# Patient Record
Sex: Male | Born: 1970 | Race: White | Hispanic: No | Marital: Married | Smoking: Never smoker
Health system: Southern US, Community
[De-identification: ages and names within clinical notes are randomized; demographics above are authoritative.]

## PROBLEM LIST (undated history)

## (undated) DIAGNOSIS — E785 Hyperlipidemia, unspecified: Secondary | ICD-10-CM

## (undated) DIAGNOSIS — I10 Essential (primary) hypertension: Secondary | ICD-10-CM

## (undated) HISTORY — PX: KIDNEY STONE SURGERY: SHX686

---

## 2010-05-12 ENCOUNTER — Ambulatory Visit: Payer: Self-pay | Admitting: Diagnostic Radiology

## 2010-05-12 ENCOUNTER — Emergency Department (HOSPITAL_BASED_OUTPATIENT_CLINIC_OR_DEPARTMENT_OTHER): Admission: EM | Admit: 2010-05-12 | Discharge: 2010-05-12 | Payer: Self-pay | Admitting: Emergency Medicine

## 2011-02-13 LAB — URINE CULTURE
Colony Count: NO GROWTH
Culture: NO GROWTH

## 2011-02-13 LAB — URINALYSIS, ROUTINE W REFLEX MICROSCOPIC
Glucose, UA: 500 mg/dL — AB
Leukocytes, UA: NEGATIVE

## 2011-02-13 LAB — CBC
HCT: 42.6 % (ref 39.0–52.0)
Hemoglobin: 14.4 g/dL (ref 13.0–17.0)

## 2011-02-13 LAB — DIFFERENTIAL
Basophils Relative: 0 % (ref 0–1)
Eosinophils Absolute: 0.2 10*3/uL (ref 0.0–0.7)
Eosinophils Relative: 1 % (ref 0–5)
Lymphs Abs: 1.3 10*3/uL (ref 0.7–4.0)
Monocytes Absolute: 0.2 10*3/uL (ref 0.1–1.0)
Monocytes Relative: 1 % — ABNORMAL LOW (ref 3–12)
Neutro Abs: 15.1 10*3/uL — ABNORMAL HIGH (ref 1.7–7.7)
Neutrophils Relative %: 90 % — ABNORMAL HIGH (ref 43–77)

## 2011-02-13 LAB — BASIC METABOLIC PANEL
CO2: 25 mEq/L (ref 19–32)
Chloride: 109 mEq/L (ref 96–112)
Creatinine, Ser: 1.1 mg/dL (ref 0.4–1.5)
Glucose, Bld: 158 mg/dL — ABNORMAL HIGH (ref 70–99)

## 2011-02-13 LAB — URINE MICROSCOPIC-ADD ON

## 2013-01-28 ENCOUNTER — Emergency Department (INDEPENDENT_AMBULATORY_CARE_PROVIDER_SITE_OTHER)
Admission: EM | Admit: 2013-01-28 | Discharge: 2013-01-28 | Disposition: A | Discharge status: Home / Self Care | Payer: 59 | Source: Home / Self Care | Attending: Family Medicine | Admitting: Family Medicine

## 2013-01-28 ENCOUNTER — Ambulatory Visit (INDEPENDENT_AMBULATORY_CARE_PROVIDER_SITE_OTHER): Payer: 59 | Admitting: Sports Medicine

## 2013-01-28 ENCOUNTER — Encounter: Payer: Self-pay | Admitting: *Deleted

## 2013-01-28 DIAGNOSIS — M25512 Pain in left shoulder: Secondary | ICD-10-CM

## 2013-01-28 DIAGNOSIS — M25519 Pain in unspecified shoulder: Secondary | ICD-10-CM

## 2013-01-28 DIAGNOSIS — M7522 Bicipital tendinitis, left shoulder: Secondary | ICD-10-CM

## 2013-01-28 HISTORY — DX: Essential (primary) hypertension: I10

## 2013-01-28 HISTORY — DX: Hyperlipidemia, unspecified: E78.5

## 2013-01-28 MED ORDER — MELOXICAM 15 MG PO TABS: ORAL_TABLET | ORAL | Status: DC

## 2013-01-28 NOTE — ED Provider Notes (Signed)
History     CSN: [id number]  Arrival date & time 01/28/13  0935   First MD Initiated Contact with Patient 01/28/13 [phone]      Chief Complaint  Patient presents with  . Shoulder Pain   HPI Comments: L shoulder pain x 2 days  No known injury Pt woke up from sleep with moderate  L shoulder pain  No distal paresthesias or numbness.  No prior hx/o shoulder injury  Pain worse with shoulder abduction >60 degrees. Grip strength intact    Patient is a 42 y.o. male presenting with shoulder pain. The history is provided by the patient.  Shoulder Pain This is a new problem. The current episode started 2 days ago. The problem occurs daily. Exacerbated by: Lifting arm > 60 degrees. The symptoms are relieved by rest.    Past Medical History  Diagnosis Date  . Hypertension   . Hyperlipemia     Past Surgical History  Procedure Laterality Date  . Kidney stone surgery      Family History  Problem Relation Age of Onset  . Hypertension Mother     History  Substance Use Topics  . Smoking status: Never Smoker   . Smokeless tobacco: Not on file  . Alcohol Use: Yes      Review of Systems  All other systems reviewed and are negative.    Allergies  Review of patient's allergies indicates no known allergies.  Home Medications   Current Outpatient Rx  Name  Route  Sig  Dispense  Refill  . lisinopril (PRINIVIL,ZESTRIL) 20 MG tablet   Oral   Take 20 mg by mouth daily.           BP 149/90  Pulse 99  Temp(Src) 98.7 F (37.1 C) (Oral)  Ht 6\' 1"  (1.854 m)  Wt 299 lb (135.626 kg)  BMI 39.46 kg/m2  SpO2 98%  Physical Exam  Constitutional:  Obese, NAD   HENT:  Head: Normocephalic and atraumatic.  Eyes: Conjunctivae are normal. Pupils are equal, round, and reactive to light.  Neck: Normal range of motion. Neck supple.  Cardiovascular: Normal rate and regular rhythm.   Pulmonary/Chest: Effort normal and breath sounds normal.  Abdominal: Soft.  Musculoskeletal:   Shoulder: Inspection reveals no abnormalities, atrophy or asymmetry. + TTP in bicipital groove ROM is full in all planes+ pain with L shoulder abduction >60 deg Rotator cuff strength normal throughout. Empty can negative  Speeds and Yergason's tests normal. No labral pathology noted with negative Obrien's, negative clunk and good stability. Normal scapular function observed. No painful arc and no drop arm sign. No apprehension sign   Neurological: He is alert.  Skin: Skin is warm.    ED Course  Procedures (including critical care time)  Labs Reviewed - No data to display No results found.   1. Left shoulder pain   2. Biceps tendonitis, left       MDM  Exam most consistent with biceps tendonitis.  Will consult with sports management to further evaluate.  Defer treatment plan per sports medicine.     The patient and/or caregiver has been counseled thoroughly with regard to treatment plan and/or medications prescribed including dosage, schedule, interactions, rationale for use, and possible side effects and they verbalize understanding. Diagnoses and expected course of recovery discussed and will return if not improved as expected or if the condition worsens. Patient and/or caregiver verbalized understanding.             Doree Albee, MD  02/05/13 0823 

## 2013-01-28 NOTE — Progress Notes (Signed)
  Subjective:    I'm seeing this patient as a consultation for:  Dr. Andrena Mews  CC: Left shoulder pain  HPI: This is a pleasant 42 year-old gentleman who presents with left shoulder pain for two days. The pain was present when he awoke two days ago, and is localized deep in the anterior shoulder. It is has remained unchanged since onset. He is unable to identify any inciting injury. He describes the pain as "dull and achy" and 2/10 in severity at rest. The pain is sharp and reaches 7-8/10 in severity with certain motions. Reaching overhead makes the pain worse. He has taken ibuprofen with minimal improvement.   He denies numbness, tingling, or pain that radiates down his left arm. No joint swelling.  Past medical history, Surgical history, Family history not pertinant except as noted below, Social history, Allergies, and medications have been entered into the medical record, reviewed, and no changes needed.   Review of Systems: No headache, visual changes, nausea, vomiting, diarrhea, constipation, dizziness, abdominal pain, skin rash, fevers, chills, night sweats, weight loss, swollen lymph nodes, body aches, joint swelling, muscle aches, chest pain, shortness of breath, mood changes, visual or auditory hallucinations.   Objective:   General: Well Developed, well nourished, and in no acute distress.  Neuro/Psych: Alert and oriented x3, extra-ocular muscles intact, able to move all 4 extremities, sensation grossly intact. Skin: Warm and dry, no rashes noted.  Respiratory: Not using accessory muscles, speaking in full sentences, trachea midline.  Cardiovascular: Pulses palpable, no extremity edema. Abdomen: Does not appear distended. Right Shoulder: Inspection reveals no abnormalities, atrophy or asymmetry. Palpation reveals mild tenderness over bicipital groove. Decreased ROM with shoulder extension and external rotation. Rotator cuff strength normal throughout. No signs of impingement  with negative Neer and Hawkin's tests, empty can sign. Speeds and Yergason's tests positive. No labral pathology noted with negative Obrien's, negative clunk and good stability. No painful arc and no drop arm sign. No apprehension sign Impression and Recommendations:   This case required medical decision making of moderate complexity.  I was present for all essential parts of this visit and procedure. Ihor Austin. Benjamin Stain, M.D.

## 2013-01-28 NOTE — ED Notes (Signed)
Pt c/o LT shoulder pain x 2 days. Denies injury. He has taken IBF for pain.

## 2013-01-28 NOTE — Assessment & Plan Note (Signed)
Symptoms likely represent biceps tendinitis. We will start conservatively with Mobic, a sling, and home exercises. I am giving him a work note. He will see me back in 4 weeks if no better we can consider a guided injection into the sheath.

## 2013-02-05 ENCOUNTER — Ambulatory Visit (INDEPENDENT_AMBULATORY_CARE_PROVIDER_SITE_OTHER): Payer: 59 | Admitting: Sports Medicine

## 2013-02-05 ENCOUNTER — Encounter: Payer: Self-pay | Admitting: Sports Medicine

## 2013-02-05 DIAGNOSIS — M25519 Pain in unspecified shoulder: Secondary | ICD-10-CM

## 2013-02-05 DIAGNOSIS — M25512 Pain in left shoulder: Secondary | ICD-10-CM

## 2013-02-05 NOTE — Progress Notes (Signed)
  Subjective:    CC: 1 wk follow up after left biceps tendonitis  HPI: This is a pleasant 42 year-old gentleman who presents for follow up after being treated for left biceps tendonitis one week ago. He has taken meloxicam as needed for pain. He kept the left arm in a sling for about two days after the initial visit. For two days now, he has not required any anti-inflammatory medications and reports no pain or limitations in activity. He wishes to return to work immediately.  Past medical history, Surgical history, Family history not pertinant except as noted below, Social history, Allergies, and medications have been entered into the medical record, reviewed, and no changes needed.   Review of Systems: No headache, visual changes, nausea, vomiting, diarrhea, constipation, dizziness, abdominal pain, skin rash, fevers, chills, night sweats, weight loss, swollen lymph nodes, body aches, joint swelling, muscle aches, chest pain, shortness of breath, mood changes, visual or auditory hallucinations.   Objective:   General: Well Developed, well nourished, and in no acute distress.  Neuro/Psych: Alert and oriented x3, extra-ocular muscles intact, able to move all 4 extremities, sensation grossly intact. Skin: Warm and dry, no rashes noted.  Respiratory: Not using accessory muscles, speaking in full sentences, trachea midline.  Cardiovascular: Pulses palpable, no extremity edema. Abdomen: Does not appear distended. Left Shoulder: Inspection reveals no abnormalities, atrophy or asymmetry. Palpation is normal with no tenderness over AC joint or bicipital groove. ROM is full in all planes. Rotator cuff strength normal throughout. No signs of impingement with negative Neer and Hawkin's tests, empty can sign. Speeds and Yergason's tests normal. No labral pathology noted with negative Obrien's, negative clunk and good stability. Normal scapular function observed. No painful arc and no drop arm sign. No  apprehension sign. Impression and Recommendations:   This case required medical decision making of moderate complexity.

## 2013-02-05 NOTE — Assessment & Plan Note (Signed)
Left biceps tendinitis. 100% resolved with conservative measures and rehabilitation. Return as needed, work note written.

## 2013-04-30 ENCOUNTER — Ambulatory Visit (INDEPENDENT_AMBULATORY_CARE_PROVIDER_SITE_OTHER): Payer: Self-pay

## 2013-04-30 ENCOUNTER — Other Ambulatory Visit: Payer: Self-pay | Admitting: Emergency Medicine

## 2013-04-30 DIAGNOSIS — M25562 Pain in left knee: Secondary | ICD-10-CM

## 2013-04-30 DIAGNOSIS — M25569 Pain in unspecified knee: Secondary | ICD-10-CM

## 2013-05-09 ENCOUNTER — Ambulatory Visit (INDEPENDENT_AMBULATORY_CARE_PROVIDER_SITE_OTHER): Payer: 59 | Admitting: Sports Medicine

## 2013-05-09 ENCOUNTER — Encounter: Payer: Self-pay | Admitting: Sports Medicine

## 2013-05-09 VITALS — BP 114/76 | HR 74

## 2013-05-09 DIAGNOSIS — M25519 Pain in unspecified shoulder: Secondary | ICD-10-CM

## 2013-05-09 DIAGNOSIS — Z0000 Encounter for general adult medical examination without abnormal findings: Secondary | ICD-10-CM | POA: Insufficient documentation

## 2013-05-09 DIAGNOSIS — I10 Essential (primary) hypertension: Secondary | ICD-10-CM | POA: Insufficient documentation

## 2013-05-09 DIAGNOSIS — M25512 Pain in left shoulder: Secondary | ICD-10-CM

## 2013-05-09 DIAGNOSIS — Z299 Encounter for prophylactic measures, unspecified: Secondary | ICD-10-CM

## 2013-05-09 MED ORDER — LISINOPRIL-HYDROCHLOROTHIAZIDE 20-12.5 MG PO TABS: 1.0000 | ORAL_TABLET | Freq: Every day | ORAL | Status: DC

## 2013-05-09 NOTE — Assessment & Plan Note (Signed)
Checking routine blood work. He will come back for complete physical.

## 2013-05-09 NOTE — Assessment & Plan Note (Signed)
Continues to be 100% resolved.

## 2013-05-09 NOTE — Progress Notes (Signed)
  Subjective:    CC: Followup  HPI: Biceps tendinitis: Completely resolved.  Hypertension: Very well controlled on lisinopril/Hydrocort thiazide, need refills.  Preventive measure: Due for complete physical, due for some blood work.  Past medical history, Surgical history, Family history not pertinant except as noted below, Social history, Allergies, and medications have been entered into the medical record, reviewed, and no changes needed.   Review of Systems: No fevers, chills, night sweats, weight loss, chest pain, or shortness of breath.   Objective:    General: Well Developed, well nourished, and in no acute distress.  Neuro: Alert and oriented x3, extra-ocular muscles intact, sensation grossly intact.  HEENT: Normocephalic, atraumatic, pupils equal round reactive to light, neck supple, no masses, no lymphadenopathy, thyroid nonpalpable.  Skin: Warm and dry, no rashes. Cardiac: Regular rate and rhythm, no murmurs rubs or gallops, no lower extremity edema.  Respiratory: Clear to auscultation bilaterally. Not using accessory muscles, speaking in full sentences. Left Shoulder: Inspection reveals no abnormalities, atrophy or asymmetry. Palpation is normal with no tenderness over AC joint or bicipital groove. ROM is full in all planes. Rotator cuff strength normal throughout. No signs of impingement with negative Neer and Hawkin's tests, empty can sign. Speeds and Yergason's tests normal. No labral pathology noted with negative Obrien's, negative clunk and good stability. Normal scapular function observed. No painful arc and no drop arm sign. No apprehension sign Impression and Recommendations:

## 2013-05-09 NOTE — Assessment & Plan Note (Signed)
Well-controlled, refilling with one-year supply of lisinopril/hctz.

## 2013-05-17 LAB — CBC
HCT: 38.3 % — ABNORMAL LOW (ref 39.0–52.0)
Hemoglobin: 13.2 g/dL (ref 13.0–17.0)
MCH: 27.2 pg (ref 26.0–34.0)
MCHC: 34.5 g/dL (ref 30.0–36.0)
MCV: 79 fL (ref 78.0–100.0)
Platelets: 350 10*3/uL (ref 150–400)
RBC: 4.85 MIL/uL (ref 4.22–5.81)
RDW: 14.6 % (ref 11.5–15.5)
WBC: 8.4 10*3/uL (ref 4.0–10.5)

## 2013-05-17 LAB — COMPREHENSIVE METABOLIC PANEL WITH GFR
ALT: 28 U/L (ref 0–53)
AST: 17 U/L (ref 0–37)
Albumin: 3.7 g/dL (ref 3.5–5.2)
Alkaline Phosphatase: 77 U/L (ref 39–117)
BUN: 14 mg/dL (ref 6–23)
CO2: 22 meq/L (ref 19–32)
Calcium: 8.9 mg/dL (ref 8.4–10.5)
Chloride: 107 meq/L (ref 96–112)
Creat: 0.95 mg/dL (ref 0.50–1.35)
Glucose, Bld: 91 mg/dL (ref 70–99)
Potassium: 4.4 meq/L (ref 3.5–5.3)
Sodium: 138 meq/L (ref 135–145)
Total Bilirubin: 0.5 mg/dL (ref 0.3–1.2)
Total Protein: 7 g/dL (ref 6.0–8.3)

## 2013-05-17 LAB — TSH: TSH: 4.339 u[IU]/mL (ref 0.350–4.500)

## 2013-05-17 LAB — LIPID PANEL
Cholesterol: 184 mg/dL (ref 0–200)
HDL: 28 mg/dL — ABNORMAL LOW
LDL Cholesterol: 123 mg/dL — ABNORMAL HIGH (ref 0–99)
Total CHOL/HDL Ratio: 6.6 ratio
Triglycerides: 167 mg/dL — ABNORMAL HIGH
VLDL: 33 mg/dL (ref 0–40)

## 2013-05-17 LAB — HEMOGLOBIN A1C
Hgb A1c MFr Bld: 5.5 %
Mean Plasma Glucose: 111 mg/dL

## 2013-05-18 LAB — VITAMIN D 25 HYDROXY (VIT D DEFICIENCY, FRACTURES): Vit D, 25-Hydroxy: 36 ng/mL (ref 30–89)

## 2013-05-20 LAB — TESTOSTERONE, FREE, TOTAL, SHBG
Sex Hormone Binding: 21 nmol/L (ref 13–71)
Testosterone, Free: 44.7 pg/mL — ABNORMAL LOW (ref 47.0–244.0)
Testosterone-% Free: 2.4 % (ref 1.6–2.9)
Testosterone: 186 ng/dL — ABNORMAL LOW (ref 300–890)

## 2013-06-07 ENCOUNTER — Ambulatory Visit (INDEPENDENT_AMBULATORY_CARE_PROVIDER_SITE_OTHER): Payer: 59

## 2013-06-07 ENCOUNTER — Ambulatory Visit (INDEPENDENT_AMBULATORY_CARE_PROVIDER_SITE_OTHER): Payer: 59 | Admitting: Sports Medicine

## 2013-06-07 ENCOUNTER — Encounter: Payer: Self-pay | Admitting: Sports Medicine

## 2013-06-07 VITALS — BP 141/85 | HR 69 | Wt 297.0 lb

## 2013-06-07 DIAGNOSIS — M25561 Pain in right knee: Secondary | ICD-10-CM | POA: Insufficient documentation

## 2013-06-07 DIAGNOSIS — R062 Wheezing: Secondary | ICD-10-CM

## 2013-06-07 DIAGNOSIS — I10 Essential (primary) hypertension: Secondary | ICD-10-CM

## 2013-06-07 DIAGNOSIS — M25569 Pain in unspecified knee: Secondary | ICD-10-CM

## 2013-06-07 DIAGNOSIS — M25562 Pain in left knee: Secondary | ICD-10-CM

## 2013-06-07 DIAGNOSIS — Z299 Encounter for prophylactic measures, unspecified: Secondary | ICD-10-CM

## 2013-06-07 DIAGNOSIS — Z0000 Encounter for general adult medical examination without abnormal findings: Secondary | ICD-10-CM

## 2013-06-07 MED ORDER — MELOXICAM 15 MG PO TABS: ORAL_TABLET | ORAL | Status: DC

## 2013-06-07 NOTE — Assessment & Plan Note (Signed)
Complete physical performed today. 

## 2013-06-07 NOTE — Assessment & Plan Note (Signed)
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 This is likely too early to see on x-ray. Mobic as needed, patellofemoral exercises. Return on an as needed basis for this.

## 2013-06-07 NOTE — Assessment & Plan Note (Signed)
Isolated and in the left upper lobe. Asymptomatic. Chest x-ray for baseline. Return as needed for this.

## 2013-06-07 NOTE — Progress Notes (Signed)
  Subjective:    CC: Complete physical exam  HPI:  Preventive measure: Complete physical exam will be performed today.  Left knee pain: Painful at the joint lines, as well as anterior knee, gelling is present, pain does not radiate, mild, persistent. Aleve is helpful.  Hypertension: Admits that he missed a few doses, no chest pain, visual changes, headaches.  Wheezing: Will be described below, he is asymptomatic.  Past medical history, Surgical history, Family history not pertinant except as noted below, Social history, Allergies, and medications have been entered into the medical record, reviewed, and no changes needed.   Review of Systems: No headache, visual changes, nausea, vomiting, diarrhea, constipation, dizziness, abdominal pain, skin rash, fevers, chills, night sweats, swollen lymph nodes, weight loss, chest pain, body aches, joint swelling, muscle aches, shortness of breath, mood changes, visual or auditory hallucinations.  Objective:    General: Well Developed, well nourished, and in no acute distress.  Neuro: Alert and oriented x3, extra-ocular muscles intact, sensation grossly intact.  HEENT: Normocephalic, atraumatic, pupils equal round reactive to light, neck supple, no masses, no lymphadenopathy, thyroid nonpalpable.  Skin: Warm and dry, no rashes noted.  Cardiac: Regular rate and rhythm, no murmurs rubs or gallops.  Respiratory: Diffuse inspiratory wheeze in the left upper lung field. Not using accessory muscles, speaking in full sentences.  Abdominal: Soft, nontender, nondistended, positive bowel sounds, no masses, no organomegaly.  Left Knee: Normal to inspection with no erythema or effusion or obvious bony abnormalities. Minimally tender to palpation with mild patellar grind. ROM full in flexion and extension and lower leg rotation. Ligaments with solid consistent endpoints including ACL, PCL, LCL, MCL. Negative Mcmurray's, Apley's, and Thessalonian tests. Non  painful patellar compression. Patellar glide with crepitus. Patellar and quadriceps tendons unremarkable. Hamstring and quadriceps strength is normal.  Impression and Recommendations:    The patient was counselled, risk factors were discussed, anticipatory guidance given.

## 2013-06-07 NOTE — Assessment & Plan Note (Signed)
Admits to noncompliance with medications. Has been relatively well controlled in the past with current dose, no changes.

## 2013-10-03 ENCOUNTER — Other Ambulatory Visit: Payer: Self-pay

## 2014-07-21 ENCOUNTER — Other Ambulatory Visit: Payer: Self-pay | Admitting: Sports Medicine

## 2014-08-25 ENCOUNTER — Other Ambulatory Visit: Payer: Self-pay | Admitting: Sports Medicine

## 2014-10-09 IMAGING — CR DG KNEE COMPLETE 4+V*L*
4 series · 4 of 4 positions shown · non-contrast
Comparison: None.

CLINICAL DATA: Left knee pain following repetitive motion

LEFT KNEE - COMPLETE 4+ VIEW

[view not recorded (1 of 4)]
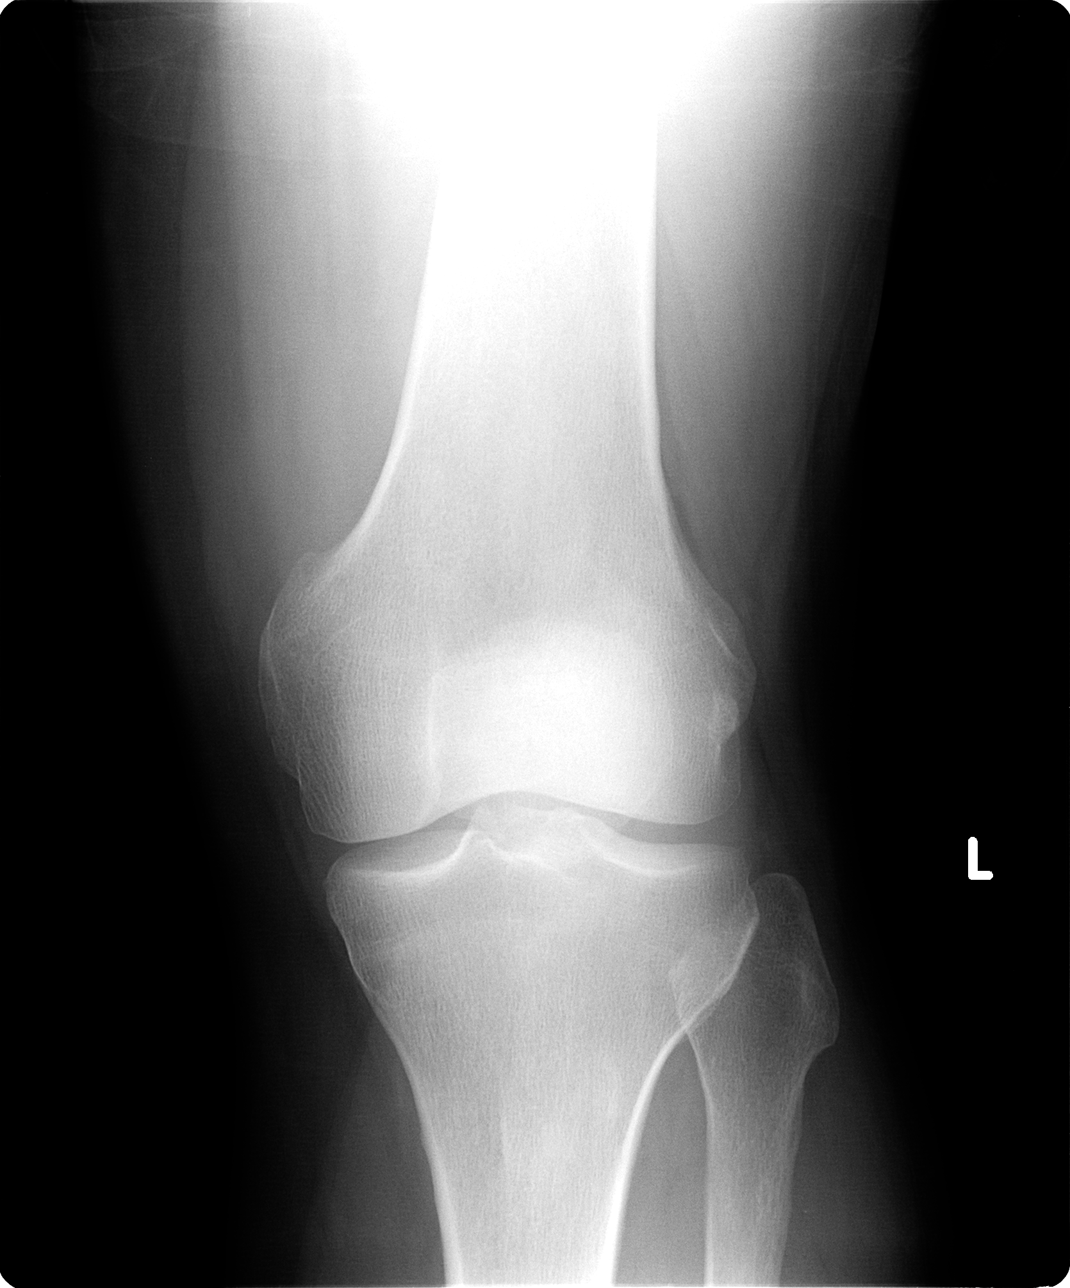

[view not recorded (2 of 4)]
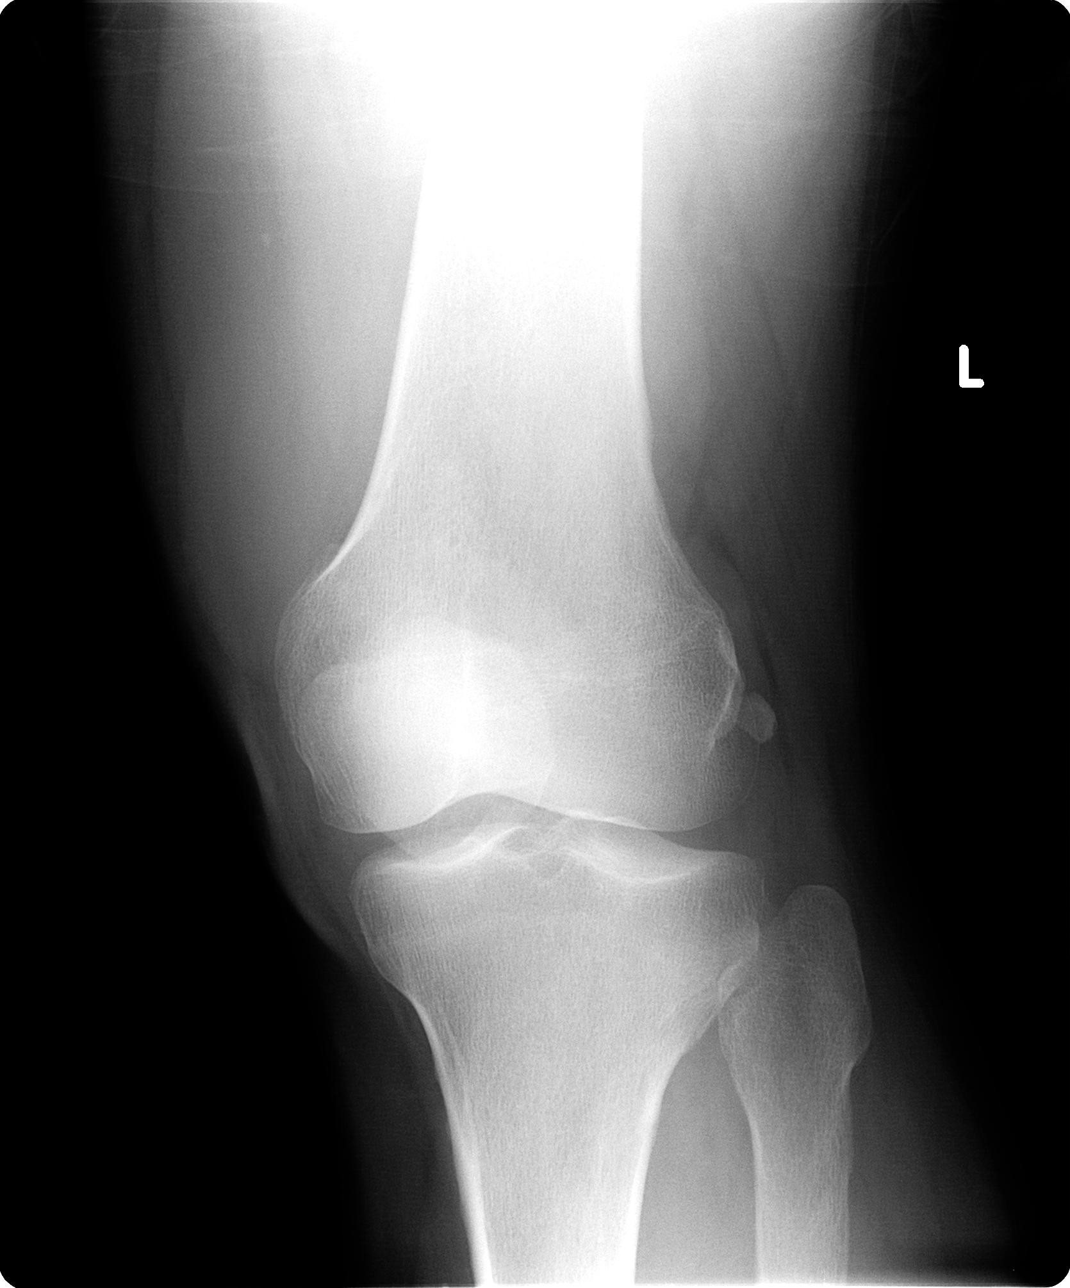

[view not recorded (3 of 4)]
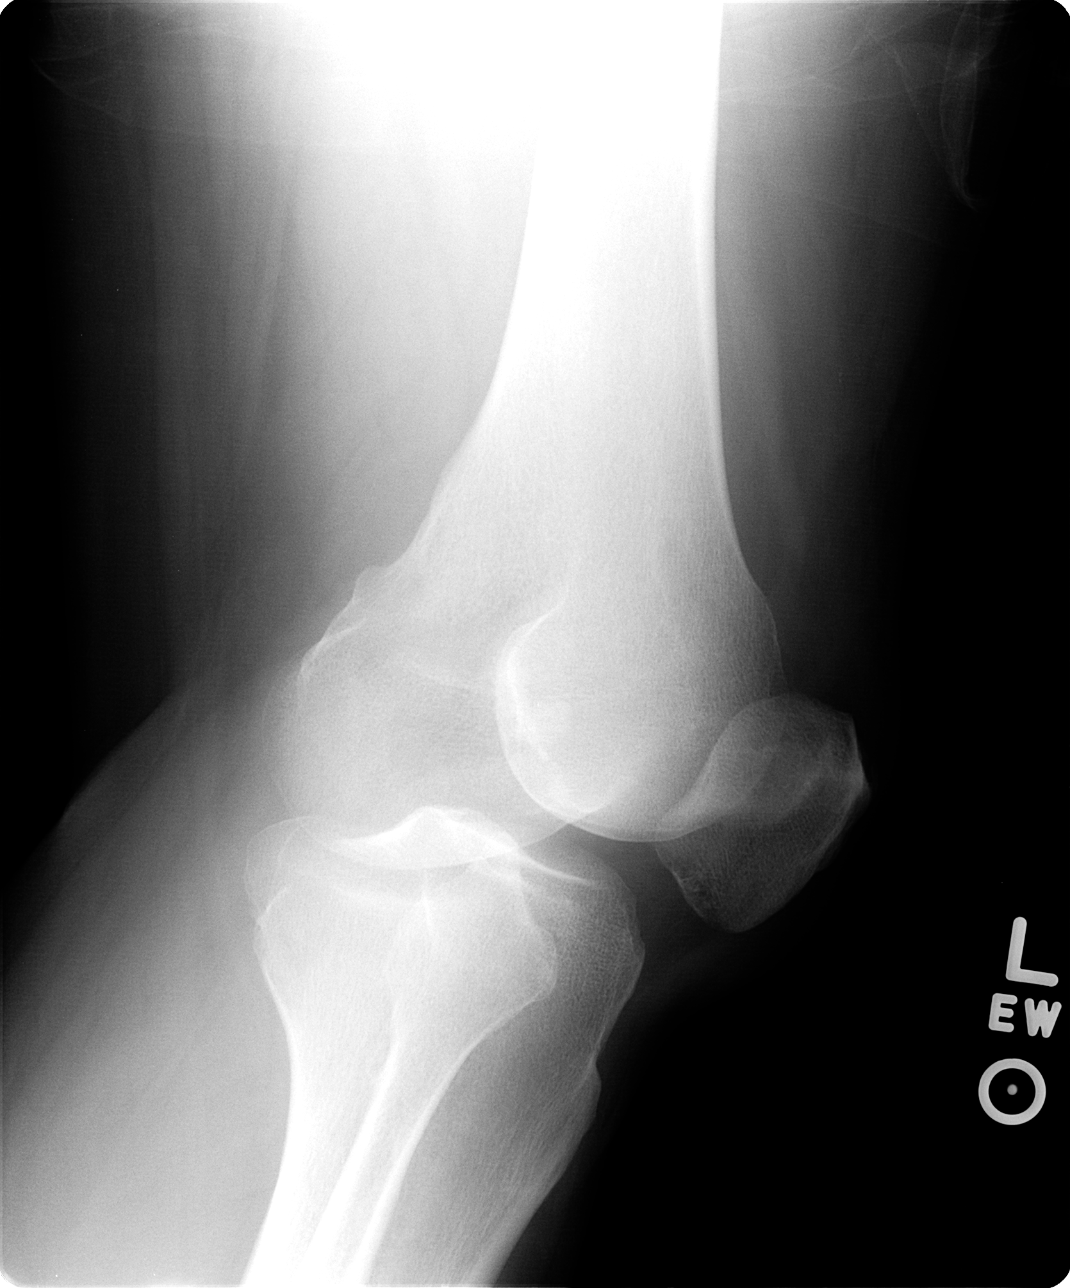

[view not recorded (4 of 4)]
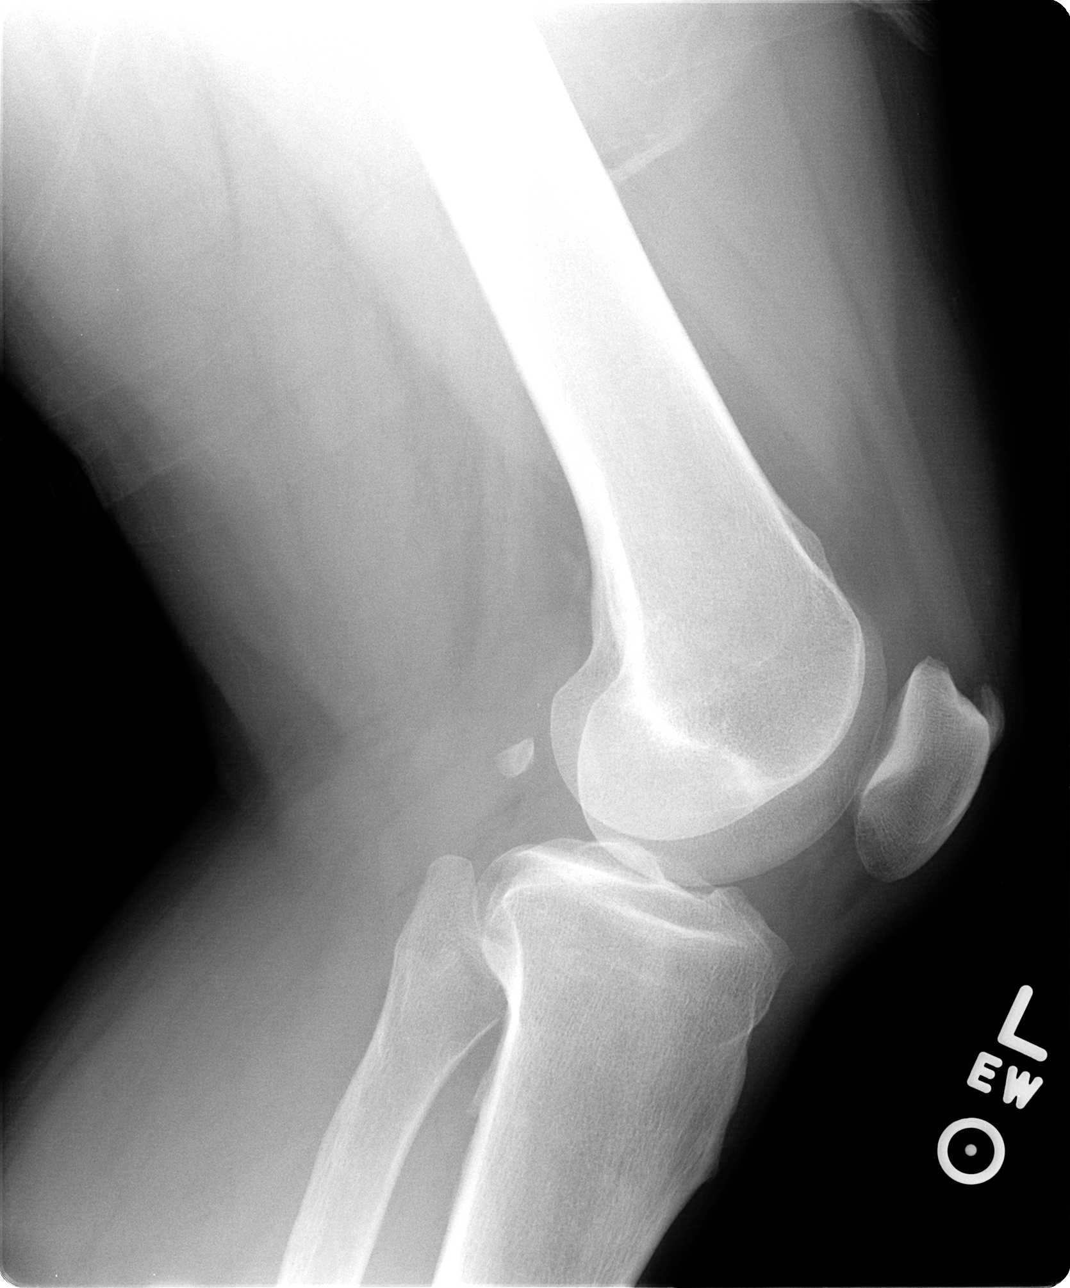

[4 of 4 positions shown; findings below may reference images not displayed]

FINDINGS: No acute fracture or dislocation is identified.  No gross
soft tissue abnormality is seen.
IMPRESSION: No acute abnormality is noted.

## 2014-10-16 ENCOUNTER — Ambulatory Visit (INDEPENDENT_AMBULATORY_CARE_PROVIDER_SITE_OTHER): Payer: 59 | Admitting: Sports Medicine

## 2014-10-16 ENCOUNTER — Encounter: Payer: Self-pay | Admitting: Sports Medicine

## 2014-10-16 ENCOUNTER — Other Ambulatory Visit: Payer: Self-pay | Admitting: Sports Medicine

## 2014-10-16 VITALS — BP 106/67 | HR 77 | Ht 73.0 in | Wt 289.0 lb

## 2014-10-16 DIAGNOSIS — M25561 Pain in right knee: Secondary | ICD-10-CM

## 2014-10-16 DIAGNOSIS — Z23 Encounter for immunization: Secondary | ICD-10-CM

## 2014-10-16 DIAGNOSIS — I10 Essential (primary) hypertension: Secondary | ICD-10-CM

## 2014-10-16 DIAGNOSIS — Z0000 Encounter for general adult medical examination without abnormal findings: Secondary | ICD-10-CM

## 2014-10-16 DIAGNOSIS — M25562 Pain in left knee: Secondary | ICD-10-CM

## 2014-10-16 MED ORDER — LISINOPRIL-HYDROCHLOROTHIAZIDE 20-12.5 MG PO TABS: ORAL_TABLET | ORAL | Status: DC

## 2014-10-16 NOTE — Assessment & Plan Note (Signed)
Checking routine blood work, lipids were slightly high at the previous check. Declines influenza vaccination, tetanus vaccination today.

## 2014-10-16 NOTE — Assessment & Plan Note (Signed)
Well-controlled, refilling medication. 

## 2014-10-16 NOTE — Progress Notes (Signed)
  Subjective:    CC: Follow-up  HPI: I haven't seen call in some time now, his wheezing has resolved.  Hypertension: Well controlled, needs a refill.  Bilateral knee pain: Well controlled with an occasional anti-inflammatory.  Annual physical: Didn't declines influenza vaccine, amenable to do Tdap today.  Past medical history, Surgical history, Family history not pertinant except as noted below, Social history, Allergies, and medications have been entered into the medical record, reviewed, and no changes needed.   Review of Systems: No fevers, chills, night sweats, weight loss, chest pain, or shortness of breath.   Objective:    General: Well Developed, well nourished, and in no acute distress.  Neuro: Alert and oriented x3, extra-ocular muscles intact, sensation grossly intact.  HEENT: Normocephalic, atraumatic, pupils equal round reactive to light, neck supple, no masses, no lymphadenopathy, thyroid nonpalpable.  Skin: Warm and dry, no rashes. Cardiac: Regular rate and rhythm, no murmurs rubs or gallops, no lower extremity edema.  Respiratory: Clear to auscultation bilaterally. Not using accessory muscles, speaking in full sentences.  Impression and Recommendations:

## 2014-10-16 NOTE — Assessment & Plan Note (Signed)
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 Continue meloxicam. Next line return if needed for injection and physical therapy.

## 2014-10-17 ENCOUNTER — Encounter: Payer: Self-pay | Admitting: Sports Medicine

## 2014-10-17 DIAGNOSIS — E785 Hyperlipidemia, unspecified: Secondary | ICD-10-CM | POA: Insufficient documentation

## 2014-10-17 LAB — LIPID PANEL
Cholesterol: 190 mg/dL (ref 0–200)
HDL: 33 mg/dL — ABNORMAL LOW (ref >39)
LDL Cholesterol: 132 mg/dL — ABNORMAL HIGH (ref 0–99)
Total CHOL/HDL Ratio: 5.8 ratio
Triglycerides: 126 mg/dL (ref <150)
VLDL: 25 mg/dL (ref 0–40)

## 2014-10-17 LAB — COMPREHENSIVE METABOLIC PANEL WITH GFR
AST: 21 U/L (ref 0–37)
Albumin: 4.1 g/dL (ref 3.5–5.2)
CO2: 27 meq/L (ref 19–32)
Calcium: 9.5 mg/dL (ref 8.4–10.5)
Creat: 0.92 mg/dL (ref 0.50–1.35)
Glucose, Bld: 88 mg/dL (ref 70–99)
Potassium: 4.6 meq/L (ref 3.5–5.3)
Total Protein: 7.3 g/dL (ref 6.0–8.3)

## 2014-10-17 LAB — COMPREHENSIVE METABOLIC PANEL
ALT: 27 U/L (ref 0–53)
Alkaline Phosphatase: 77 U/L (ref 39–117)
BUN: 19 mg/dL (ref 6–23)
Chloride: 102 mEq/L (ref 96–112)
Sodium: 139 mEq/L (ref 135–145)
Total Bilirubin: 0.8 mg/dL (ref 0.2–1.2)

## 2014-10-17 LAB — CBC
HCT: 43.4 % (ref 39.0–52.0)
Hemoglobin: 14.6 g/dL (ref 13.0–17.0)
MCH: 27.8 pg (ref 26.0–34.0)
MCHC: 33.6 g/dL (ref 30.0–36.0)
MCV: 82.7 fL (ref 78.0–100.0)
MPV: 8 fL — ABNORMAL LOW (ref 9.4–12.4)
Platelets: 305 K/uL (ref 150–400)
RBC: 5.25 MIL/uL (ref 4.22–5.81)
RDW: 14.7 % (ref 11.5–15.5)
WBC: 8.1 10*3/uL (ref 4.0–10.5)

## 2014-10-17 LAB — TSH: TSH: 4.239 u[IU]/mL (ref 0.350–4.500)

## 2014-10-17 LAB — HEMOGLOBIN A1C
Hgb A1c MFr Bld: 5.8 % — ABNORMAL HIGH (ref <5.7)
Mean Plasma Glucose: 120 mg/dL — ABNORMAL HIGH (ref <117)

## 2014-12-23 ENCOUNTER — Other Ambulatory Visit: Payer: Self-pay | Admitting: Sports Medicine

## 2015-04-19 ENCOUNTER — Emergency Department (INDEPENDENT_AMBULATORY_CARE_PROVIDER_SITE_OTHER)
Admission: EM | Admit: 2015-04-19 | Discharge: 2015-04-19 | Disposition: A | Discharge status: Home / Self Care | Payer: 59 | Source: Home / Self Care | Attending: Family Medicine | Admitting: Family Medicine

## 2015-04-19 ENCOUNTER — Encounter: Payer: Self-pay | Admitting: Emergency Medicine

## 2015-04-19 DIAGNOSIS — K649 Unspecified hemorrhoids: Secondary | ICD-10-CM | POA: Diagnosis not present

## 2015-04-19 MED ORDER — HYDROCORTISONE ACETATE 25 MG RE SUPP: 25.0000 mg | Freq: Two times a day (BID) | RECTAL | Status: DC

## 2015-04-19 NOTE — ED Notes (Signed)
Pt c/o hemorrhoids x1 week. States he is having rectal pain and bleeding.

## 2015-04-19 NOTE — ED Provider Notes (Signed)
CSN: [id number]     Arrival date & time 04/19/15  1559 History   First MD Initiated Contact with Patient 04/19/15 1641     Chief Complaint  Patient presents with  . Hemorrhoids      HPI Comments: Patient developed a painful hemorrhoid about one week ago.  He has had gradual increase in pain with sitting and bowel movements.  No abdominal pain.  Bowel movements have been normal. Yesterday he noticed sudden anal bleeding while in the shower, and significantly decreased hemorrhoid pain.  He continues to have minimal rectal bleeding. No family history of GI disorders.  Patient is a 44 y.o. male presenting with hematochezia. The history is provided by the patient and the spouse.  Rectal Bleeding Quality:  Bright red Amount:  Moderate Duration:  1 hour Timing:  Intermittent Progression:  Partially resolved Chronicity:  New Context: defecation, hemorrhoids and rectal pain   Pain details:    Quality:  Aching   Severity:  Moderate   Duration:  1 week   Timing:  Constant   Progression:  Partially resolved Similar prior episodes: no   Relieved by:  Nothing Worsened by:  Defecation Ineffective treatments:  Hemorrhoid cream Associated symptoms: no abdominal pain, no dizziness, no epistaxis, no fever, no hematemesis, no light-headedness and no vomiting     Past Medical History  Diagnosis Date  . Hypertension   . Hyperlipemia    Past Surgical History  Procedure Laterality Date  . Kidney stone surgery     Family History  Problem Relation Age of Onset  . Hypertension Mother    History  Substance Use Topics  . Smoking status: Never Smoker   . Smokeless tobacco: Not on file  . Alcohol Use: Yes    Review of Systems  Constitutional: Negative for fever.  HENT: Negative for nosebleeds.   Gastrointestinal: Positive for hematochezia. Negative for vomiting, abdominal pain and hematemesis.  Neurological: Negative for dizziness and light-headedness.  All other systems reviewed and are  negative.   Allergies  Review of patient's allergies indicates no known allergies.  Home Medications   Prior to Admission medications   Medication Sig Start Date End Date Taking? Authorizing Provider  hydrocortisone (ANUSOL-HC) 25 MG suppository Place 1 suppository (25 mg total) rectally 2 (two) times daily. 04/19/15   Lattie HawStephen A Elai Vanwyk, MD  lisinopril-hydrochlorothiazide (PRINZIDE,ZESTORETIC) 20-12.5 MG per tablet TAKE 1 TABLET BY MOUTH DAILY. 10/16/14   Monica Bectonhomas J Thekkekandam, MD  meloxicam (MOBIC) 15 MG tablet TAKE 1 TABLET BY MOUTH EVERY MORNING WITH BREAKFAST FOR 2 WEEKS, THEN DAILY AS NEEDED FOR PAIN 12/23/14   Monica Bectonhomas J Thekkekandam, MD   BP 130/87 mmHg  Pulse 83  Temp(Src) 98.2 F (36.8 C) (Oral)  Wt 291 lb (131.997 kg)  SpO2 99% Physical Exam  Constitutional: He is oriented to person, place, and time. He appears well-developed and well-nourished. No distress.  Patient is obese.  HENT:  Head: Normocephalic.  Mouth/Throat: Oropharynx is clear and moist.  Eyes: Pupils are equal, round, and reactive to light.  Neck: Neck supple.  Cardiovascular: Normal heart sounds.   Pulmonary/Chest: Breath sounds normal.  Abdominal: There is no tenderness.  Genitourinary: Rectal exam shows external hemorrhoid.     At right edge of anus, as noted on diagram,   is a ruptured hemorrhoid without active bleeding present, and minimal tenderness to palpation   Neurological: He is alert and oriented to person, place, and time.  Skin: Skin is warm and dry.  Nursing note and  vitals reviewed.   ED Course  Procedures  none   MDM   1. Bleeding hemorrhoid    Patient reassured. Rx for Anusol [facility] suppositories (optional). Begin warm sitz bath 2 or 3 times daily.  May begin using rectal suppositories when bleeding stops. Advised to increase dietary fiber. Followup with Family Doctor if not improved in one week.     Lattie Haw, MD 04/21/15 [phone]

## 2015-04-19 NOTE — Discharge Instructions (Signed)
Begin warm sitz bath 2 or 3 times daily.  May begin using rectal suppositories when bleeding stops.   Hemorrhoids Hemorrhoids are swollen veins around the rectum or anus. There are two types of hemorrhoids:   Internal hemorrhoids. These occur in the veins just inside the rectum. They may poke through to the outside and become irritated and painful.  External hemorrhoids. These occur in the veins outside the anus and can be felt as a painful swelling or hard lump near the anus. CAUSES  Pregnancy.   Obesity.   Constipation or diarrhea.   Straining to have a bowel movement.   Sitting for long periods on the toilet.  Heavy lifting or other activity that caused you to strain.  Anal intercourse. SYMPTOMS   Pain.   Anal itching or irritation.   Rectal bleeding.   Fecal leakage.   Anal swelling.   One or more lumps around the anus.  DIAGNOSIS  Your caregiver may be able to diagnose hemorrhoids by visual examination. Other examinations or tests that may be performed include:   Examination of the rectal area with a gloved hand (digital rectal exam).   Examination of anal canal using a small tube (scope).   A blood test if you have lost a significant amount of blood.  A test to look inside the colon (sigmoidoscopy or colonoscopy). TREATMENT Most hemorrhoids can be treated at home. However, if symptoms do not seem to be getting better or if you have a lot of rectal bleeding, your caregiver may perform a procedure to help make the hemorrhoids get smaller or remove them completely. Possible treatments include:   Placing a rubber band at the base of the hemorrhoid to cut off the circulation (rubber band ligation).   Injecting a chemical to shrink the hemorrhoid (sclerotherapy).   Using a tool to burn the hemorrhoid (infrared light therapy).   Surgically removing the hemorrhoid (hemorrhoidectomy).   Stapling the hemorrhoid to block blood flow to the tissue  (hemorrhoid stapling).  HOME CARE INSTRUCTIONS   Eat foods with fiber, such as whole grains, beans, nuts, fruits, and vegetables. Ask your doctor about taking products with added fiber in them (fibersupplements).  Increase fluid intake. Drink enough water and fluids to keep your urine clear or pale yellow.   Exercise regularly.   Go to the bathroom when you have the urge to have a bowel movement. Do not wait.   Avoid straining to have bowel movements.   Keep the anal area dry and clean. Use wet toilet paper or moist towelettes after a bowel movement.   Medicated creams and suppositories may be used or applied as directed.   Only take over-the-counter or prescription medicines as directed by your caregiver.   Take warm sitz baths for 15-20 minutes, 3-4 times a day to ease pain and discomfort.   Place ice packs on the hemorrhoids if they are tender and swollen. Using ice packs between sitz baths may be helpful.   Put ice in a plastic bag.   Place a towel between your skin and the bag.   Leave the ice on for 15-20 minutes, 3-4 times a day.   Do not use a donut-shaped pillow or sit on the toilet for long periods. This increases blood pooling and pain.  SEEK MEDICAL CARE IF:  You have increasing pain and swelling that is not controlled by treatment or medicine.  You have uncontrolled bleeding.  You have difficulty or you are unable to have a bowel  movement.  You have pain or inflammation outside the area of the hemorrhoids. MAKE SURE YOU:  Understand these instructions.  Will watch your condition.  Will get help right away if you are not doing well or get worse. Document Released: 11/11/2000 Document Revised: 10/31/2012 Document Reviewed: 09/18/2012 [facility] Patient Information 2015 [place], [place]. This information is not intended to replace advice given to you by your health care provider. Make sure you discuss any questions you have with your health care  provider.

## 2015-09-07 ENCOUNTER — Encounter: Payer: Self-pay | Admitting: Sports Medicine

## 2015-09-07 ENCOUNTER — Other Ambulatory Visit: Payer: Self-pay | Admitting: Sports Medicine

## 2015-09-07 ENCOUNTER — Ambulatory Visit (INDEPENDENT_AMBULATORY_CARE_PROVIDER_SITE_OTHER): Payer: 59 | Admitting: Sports Medicine

## 2015-09-07 ENCOUNTER — Ambulatory Visit (HOSPITAL_BASED_OUTPATIENT_CLINIC_OR_DEPARTMENT_OTHER)
Admission: RE | Admit: 2015-09-07 | Discharge: 2015-09-07 | Disposition: A | Discharge status: Home / Self Care | Payer: 59 | Source: Ambulatory Visit | Attending: Sports Medicine | Admitting: Sports Medicine

## 2015-09-07 VITALS — BP 120/83 | HR 68 | Ht 74.0 in | Wt 290.0 lb

## 2015-09-07 DIAGNOSIS — M79672 Pain in left foot: Secondary | ICD-10-CM | POA: Diagnosis not present

## 2015-09-07 DIAGNOSIS — E041 Nontoxic single thyroid nodule: Secondary | ICD-10-CM

## 2015-09-07 DIAGNOSIS — Z0000 Encounter for general adult medical examination without abnormal findings: Secondary | ICD-10-CM

## 2015-09-07 DIAGNOSIS — I10 Essential (primary) hypertension: Secondary | ICD-10-CM | POA: Diagnosis not present

## 2015-09-07 DIAGNOSIS — E038 Other specified hypothyroidism: Secondary | ICD-10-CM

## 2015-09-07 DIAGNOSIS — E042 Nontoxic multinodular goiter: Secondary | ICD-10-CM | POA: Diagnosis present

## 2015-09-07 DIAGNOSIS — E039 Hypothyroidism, unspecified: Secondary | ICD-10-CM

## 2015-09-07 DIAGNOSIS — M25512 Pain in left shoulder: Secondary | ICD-10-CM | POA: Diagnosis not present

## 2015-09-07 DIAGNOSIS — E785 Hyperlipidemia, unspecified: Secondary | ICD-10-CM

## 2015-09-07 LAB — LIPID PANEL
Cholesterol: 203 mg/dL — ABNORMAL HIGH (ref 125–200)
HDL: 33 mg/dL — ABNORMAL LOW (ref >=40)
LDL Cholesterol: 145 mg/dL — ABNORMAL HIGH (ref <130)
Total CHOL/HDL Ratio: 6.2 ratio — ABNORMAL HIGH (ref <=5.0)
Triglycerides: 126 mg/dL (ref <150)
VLDL: 25 mg/dL (ref <30)

## 2015-09-07 LAB — CBC
HCT: 41.2 % (ref 39.0–52.0)
Hemoglobin: 14.3 g/dL (ref 13.0–17.0)
MCH: 28.1 pg (ref 26.0–34.0)
MCHC: 34.7 g/dL (ref 30.0–36.0)
MCV: 81.1 fL (ref 78.0–100.0)
MPV: 7.8 fL — ABNORMAL LOW (ref 8.6–12.4)
Platelets: 306 10*3/uL (ref 150–400)
RBC: 5.08 MIL/uL (ref 4.22–5.81)
RDW: 14.4 % (ref 11.5–15.5)
WBC: 9 K/uL (ref 4.0–10.5)

## 2015-09-07 LAB — COMPREHENSIVE METABOLIC PANEL WITH GFR
AST: 18 U/L (ref 10–40)
BUN: 18 mg/dL (ref 7–25)
CO2: 28 mmol/L (ref 20–31)
Creat: 0.87 mg/dL (ref 0.60–1.35)
Glucose, Bld: 91 mg/dL (ref 65–99)
Sodium: 139 mmol/L (ref 135–146)
Total Bilirubin: 0.6 mg/dL (ref 0.2–1.2)

## 2015-09-07 LAB — COMPREHENSIVE METABOLIC PANEL
ALT: 27 U/L (ref 9–46)
Albumin: 4.1 g/dL (ref 3.6–5.1)
Alkaline Phosphatase: 82 U/L (ref 40–115)
Calcium: 9.6 mg/dL (ref 8.6–10.3)
Chloride: 104 mmol/L (ref 98–110)
Potassium: 5 mmol/L (ref 3.5–5.3)
Total Protein: 7.2 g/dL (ref 6.1–8.1)

## 2015-09-07 LAB — HEMOGLOBIN A1C
Hgb A1c MFr Bld: 5.9 % — ABNORMAL HIGH (ref <5.7)
Mean Plasma Glucose: 123 mg/dL — ABNORMAL HIGH (ref <117)

## 2015-09-07 MED ORDER — LISINOPRIL-HYDROCHLOROTHIAZIDE 20-12.5 MG PO TABS: ORAL_TABLET | ORAL | Status: DC

## 2015-09-07 NOTE — Assessment & Plan Note (Signed)
Well-controlled, refilling blood pressure medication, checking blood work.

## 2015-09-07 NOTE — Assessment & Plan Note (Signed)
Physical performed as above.

## 2015-09-07 NOTE — Progress Notes (Signed)
  Subjective:    CC: complete physical  HPI:  Hypertension: Well controlled, needs a refill  Knee pain: Controlled with meloxicam  Hyperlipidemia: Diet-controlled  Left shoulder pain: Present for months, wakes him up from sleep, localized in the anterior shoulder worse with abduction and external rotation. No trauma. Nothing radicular him in her neck pain and no paresthesias into the hand or fingers  Bilateral foot pain: Left worse than right, spends a great deal of time on his feet. Moderate, persistent.  Past medical history, Surgical history, Family history not pertinant except as noted below, Social history, Allergies, and medications have been entered into the medical record, reviewed, and no changes needed.   Review of Systems: No headache, visual changes, nausea, vomiting, diarrhea, constipation, dizziness, abdominal pain, skin rash, fevers, chills, night sweats, swollen lymph nodes, weight loss, chest pain, body aches, joint swelling, muscle aches, shortness of breath, mood changes, visual or auditory hallucinations.  Objective:    General: Well Developed, well nourished, and in no acute distress.  Neuro: Alert and oriented x3, extra-ocular muscles intact, sensation grossly intact. Cranial nerves II through XII are intact, motor, sensory, and coordinative functions are all intact. HEENT: Normocephalic, atraumatic, pupils equal round reactive to light, neck supple, no masses, no lymphadenopathy, thyroid does feel enlarged I am able to palpate a subcentimeter well-defined firm nodule that feels to be in the right lobe of the thyroid. Oropharynx, nasopharynx, external ear canals are unremarkable. Skin: Warm and dry, no rashes noted.  Cardiac: Regular rate and rhythm, no murmurs rubs or gallops.  Respiratory: Clear to auscultation bilaterally. Not using accessory muscles, speaking in full sentences.  Abdominal: Soft, nontender, nondistended, positive bowel sounds, no masses, no  organomegaly.  leftShoulder: Inspection reveals no abnormalities, atrophy or asymmetry. Palpation is normal with no tenderness over AC joint or bicipital groove. ROM is full in all planes. Weak to internal rotation No signs of impingement with negative Neer and Hawkin's tests, empty can.positive lift off test. Speeds and Yergason's tests normal. No labral pathology noted with negative Obrien's, negative crank, negative clunk, and good stability. Normal scapular function observed. No painful arc and no drop arm sign. No apprehension sign  Impression and Recommendations:    The patient was counselled, risk factors were discussed, anticipatory guidance given.

## 2015-09-07 NOTE — Addendum Note (Signed)
Addended by: Monica Becton on: 09/07/2015 02:20 PM   Modules accepted: Orders

## 2015-09-07 NOTE — Assessment & Plan Note (Signed)
Return for custom orthotics, he is having some left lateral fifth metatarsal pain.

## 2015-09-07 NOTE — Assessment & Plan Note (Signed)
Left-sided subscapularis dysfunction. Formal physical therapy, meloxicam, return to see me in one month, subcoracoid injection if no better.

## 2015-09-07 NOTE — Assessment & Plan Note (Addendum)
Right-sided, nontender, suspect benign thyroid nodule. Ordering TFTs and thyroid ultrasound.  Nodules worrisome, thyroid biopsy ordered.

## 2015-09-08 ENCOUNTER — Telehealth: Payer: Self-pay | Admitting: Sports Medicine

## 2015-09-08 ENCOUNTER — Encounter: Payer: Self-pay | Admitting: Sports Medicine

## 2015-09-08 ENCOUNTER — Ambulatory Visit (INDEPENDENT_AMBULATORY_CARE_PROVIDER_SITE_OTHER): Payer: 59 | Admitting: Sports Medicine

## 2015-09-08 VITALS — BP 126/86 | HR 75 | Wt 291.0 lb

## 2015-09-08 DIAGNOSIS — E039 Hypothyroidism, unspecified: Secondary | ICD-10-CM | POA: Insufficient documentation

## 2015-09-08 DIAGNOSIS — E041 Nontoxic single thyroid nodule: Secondary | ICD-10-CM | POA: Diagnosis not present

## 2015-09-08 DIAGNOSIS — E038 Other specified hypothyroidism: Secondary | ICD-10-CM | POA: Insufficient documentation

## 2015-09-08 DIAGNOSIS — R7303 Prediabetes: Secondary | ICD-10-CM

## 2015-09-08 DIAGNOSIS — E669 Obesity, unspecified: Secondary | ICD-10-CM | POA: Insufficient documentation

## 2015-09-08 DIAGNOSIS — M79672 Pain in left foot: Secondary | ICD-10-CM

## 2015-09-08 LAB — VITAMIN D 25 HYDROXY (VIT D DEFICIENCY, FRACTURES): Vit D, 25-Hydroxy: 21 ng/mL — ABNORMAL LOW (ref 30–100)

## 2015-09-08 LAB — TSH: TSH: 4.923 u[IU]/mL — ABNORMAL HIGH (ref 0.350–4.500)

## 2015-09-08 LAB — T3, FREE: T3, Free: 3 pg/mL (ref 2.3–4.2)

## 2015-09-08 LAB — T4, FREE: Free T4: 0.92 ng/dL (ref 0.80–1.80)

## 2015-09-08 MED ORDER — VITAMIN D (ERGOCALCIFEROL) 1.25 MG (50000 UNIT) PO CAPS: 50000.0000 [IU] | ORAL_CAPSULE | ORAL | Status: DC

## 2015-09-08 MED ORDER — PHENTERMINE HCL 37.5 MG PO TABS: ORAL_TABLET | ORAL | Status: DC

## 2015-09-08 MED ORDER — LEVOTHYROXINE SODIUM 50 MCG PO TABS: 50.0000 ug | ORAL_TABLET | Freq: Every day | ORAL | Status: DC

## 2015-09-08 NOTE — Assessment & Plan Note (Signed)
Awaiting biopsy

## 2015-09-08 NOTE — Assessment & Plan Note (Signed)
Starting phentermine, return monthly for weight checks and refills. Nutrition referral 

## 2015-09-08 NOTE — Addendum Note (Signed)
Addended by: Monica Becton on: 09/08/2015 08:59 AM   Modules accepted: Orders

## 2015-09-08 NOTE — Progress Notes (Signed)

## 2015-09-08 NOTE — Addendum Note (Signed)
Addended by: Monica Becton on: 09/08/2015 12:06 PM   Modules accepted: Orders

## 2015-09-08 NOTE — Telephone Encounter (Signed)
err

## 2015-09-08 NOTE — Assessment & Plan Note (Signed)
Awaiting T3 and T4, we will treat this either way, subclinical hypothyroidism untreated has a higher cardiovascular mortality.

## 2015-09-08 NOTE — Assessment & Plan Note (Signed)
We will work aggressively on weight loss.

## 2015-09-08 NOTE — Assessment & Plan Note (Signed)
Uncontrolled, patient will work on a low-cholesterol diet for the next 3 months and then we will recheck.

## 2015-09-08 NOTE — Assessment & Plan Note (Signed)
Custom orthotics as above. 

## 2015-09-08 NOTE — Assessment & Plan Note (Signed)
Subclinical hypothyroidism with an elevated TSH and normal T3 and T4, untreated this does carry a higher cardiovascular mortality so we will start with 50 g of levothyroxine and recheck TSH in 6 weeks.

## 2015-09-10 ENCOUNTER — Telehealth: Payer: Self-pay | Admitting: Sports Medicine

## 2015-09-10 NOTE — Telephone Encounter (Signed)
Received fax for prior authorization on Phentermine sent through cover my meds waiting on authorization. - CF °

## 2015-09-11 ENCOUNTER — Encounter: Payer: Self-pay | Admitting: Sports Medicine

## 2015-09-15 ENCOUNTER — Ambulatory Visit (INDEPENDENT_AMBULATORY_CARE_PROVIDER_SITE_OTHER): Payer: 59 | Admitting: Rehabilitative and Restorative Service Providers"

## 2015-09-15 ENCOUNTER — Encounter: Payer: Self-pay | Admitting: Rehabilitative and Restorative Service Providers"

## 2015-09-15 DIAGNOSIS — M25512 Pain in left shoulder: Secondary | ICD-10-CM

## 2015-09-15 DIAGNOSIS — M25612 Stiffness of left shoulder, not elsewhere classified: Secondary | ICD-10-CM

## 2015-09-15 DIAGNOSIS — R293 Abnormal posture: Secondary | ICD-10-CM | POA: Diagnosis not present

## 2015-09-15 DIAGNOSIS — Z7409 Other reduced mobility: Secondary | ICD-10-CM | POA: Diagnosis not present

## 2015-09-15 NOTE — Patient Instructions (Addendum)
Shoulder Blade Squeeze   Can use swim noodle or towel along spine Rotate shoulders back, then squeeze shoulder blades down and back. Hold 10 sec Repeat _10___ times. Do __several__ sessions per day.  Scapula Adduction With Pectoralis Stretch: Low - Standing   Shoulders at 45 hands even with shoulders, keeping weight through legs, shift weight forward until you feel pull or stretch through the front of your chest. Hold _30__ seconds. Do _3__ times, _2-4__ times per day.   Scapula Adduction With Pectoralis Stretch: Mid-Range - Standing   Shoulders at 90 elbows even with shoulders, keeping weight through legs, shift weight forward until you feel pull or strength through the front of your chest. Hold __30_ seconds. Do _3__ times, __2-4_ times per day.   Scapula Adduction With Pectoralis Stretch: High - Standing   Shoulders at 120 hands up high on the doorway, keeping weight on feet, shift weight forward until you feel pull or stretch through the front of your chest. Hold _30__ seconds. Do _3__ times, _2-3__ times per day.   Ball release work using 4 Building surveyorinch rubber ball working through the mid back and Freight forwarderpecs/chest area

## 2015-09-15 NOTE — Therapy (Signed)
[facility] [address] [place], [place], [address] Phone: [phone]   Fax:  [phone]  Physical Therapy Evaluation  Patient Details  Name: Alan Gardner MRN: [id number] Date of Birth: 05-15-1971 Referring Provider: Dr Benjamin Stainhekkekandam  Encounter Date: 09/15/2015      PT End of Session - 09/15/15 1044    Visit Number 1   Number of Visits 12   Date for PT Re-Evaluation 10/27/15   PT Start Time 1017   PT Stop Time 1112   PT Time Calculation (min) 55 min   Activity Tolerance Patient tolerated treatment well      Past Medical History  Diagnosis Date  . Hypertension   . Hyperlipemia     Past Surgical History  Procedure Laterality Date  . Kidney stone surgery      There were no vitals filed for this visit.  Visit Diagnosis:  Abnormal posture - Plan: PT plan of care cert/re-cert  Stiffness of shoulder joint, left - Plan: PT plan of care cert/re-cert  Pain in shoulder region, left - Plan: PT plan of care cert/re-cert  Decreased mobility and endurance - Plan: PT plan of care cert/re-cert      Subjective Assessment - 09/15/15 1019    Subjective Patient reports pain in the front part of the Lt shoulder for the past 02/16 with no known injury. He reports that the pain is a nagging type pain that he notices with functional activities.    Pertinent History LBP in past years ago   How long can you sit comfortably? no limit   How long can you stand comfortably? no limit   How long can you walk comfortably? no limit   Diagnostic tests none   Patient Stated Goals get rid of pain in his shoulder    Currently in Pain? Yes   Pain Score 3    Pain Location Shoulder   Pain Orientation Left;Anterior   Pain Descriptors / Indicators Nagging;Dull   Pain Onset More than a month ago   Pain Frequency Intermittent   Aggravating Factors  throwing motioin; reaching up high; lying on Lt side   Pain Relieving Factors avoiding  activities that cause             [facility] PT Assessment - 09/15/15 0001    Assessment   Medical Diagnosis Impingement Lt shoulder    Referring Provider Dr Benjamin Stainhekkekandam   Onset Date/Surgical Date 01/13/15   Hand Dominance Right   Next MD Visit 10/05/15   Prior Therapy none   Precautions   Precautions None   Balance Screen   Has the patient fallen in the past 6 months No   Has the patient had a decrease in activity level because of a fear of falling?  No   Is the patient reluctant to leave their home because of a fear of falling?  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[employer] - lifting reaching climbing bending stretching carrying physical job    Leisure household chores    Observation/Other Assessments   Focus on Therapeutic Outcomes (FOTO)  29% limitation    Posture/Postural Control   Posture Comments head forward shoulders rounded and elevated; head of the humerus anterior in orientation; incresed thoracic kyphosis; scapulae abducted and rotated along the thoracic wall   AROM   Right/Left  Shoulder --  pain with all motions Lt shoulder    Right Shoulder Extension 79 Degrees   Right Shoulder Flexion 161 Degrees   Right Shoulder ABduction 156 Degrees   Right Shoulder Internal Rotation 52 Degrees   Right Shoulder External Rotation 70 Degrees   Left Shoulder Extension 71 Degrees   Left Shoulder Flexion 154 Degrees   Left Shoulder ABduction 152 Degrees   Left Shoulder Internal Rotation 48 Degrees   Left Shoulder External Rotation 67 Degrees   Strength   Overall Strength Comments 5/5 bilat UE's    Strength Assessment Site --  pain with testing Lt flex/ER/abd    Palpation   Palpation comment muscular tightness through pecs/upper trap/levator Lt >> Rt   Hawkins-Kennedy test   Findings Negative   Side Left   Empty Can  test   Findings Positive   Side Left                   [facility] Adult PT Treatment/Exercise - 09/15/15 0001    Self-Care   Self-Care --  ball release work for home    Neuro Re-ed    Neuro Re-ed Details  working on posture and alignment; lifting chest; pulling scapulae down and back; working to engage posterior shoulder girdle musculature and inhibit pecs   Shoulder Exercises: Standing   Other Standing Exercises scap squeeze with noodle 10 sec 10 reps    Other Standing Exercises chest lift    Shoulder Exercises: Lawyer Limitations doorway 3 positions 30 sec hold 3 reps each position   Cryotherapy   Number Minutes Cryotherapy 15 Minutes   Cryotherapy Location Shoulder   Type of Cryotherapy Ice pack   Electrical Stimulation   Electrical Stimulation Location ant shoulder pec insertion   Electrical Stimulation Action IFC   Electrical Stimulation Parameters to tolerance   Electrical Stimulation Goals Pain;Other (comment)  muscle tightness                 PT Education - 09/15/15 1043    Education provided Yes   Education Details posture/alignment; HEP   Person(s) Educated Patient   Methods Explanation;Demonstration;Tactile cues;Verbal cues;Handout   Comprehension Verbalized understanding;Returned demonstration;Verbal cues required;Tactile cues required             PT Long Term Goals - 09/15/15 1239    PT LONG TERM GOAL #1   Title Increase shoulder ROM 5-10 degrees or more through Lt shoulder iin limited planes 10/27/15   Time 6   Period Weeks   Status New   PT LONG TERM GOAL #2   Title 5/5 painfree strength Lt shoulder 10/27/15    Time 6   Period Weeks   Status New   PT LONG TERM GOAL #3   Title Improve posture and alignment with improved scapular position and shoulder in anatomically improved position for movement 10/27/15   Time 6   Period Weeks   Status New   PT LONG TERM GOAL #4   Title Decrease pain to 0-2/10 with functional  activities 10/27/15   Time 6   Period Weeks   Status New   PT LONG TERM GOAL #5   Title Improve FOTO to </= 26% limitaiion 10/27/15   Time 6   Period Weeks   Status New   Additional Long Term Goals   Additional Long Term Goals Yes   PT LONG TERM GOAL #6   Title Fadel I in HEP for discharge 10/27/15   Time 6  Period Weeks   Status New               Plan - 09/15/15 1235    Clinical Impression Statement Deangleo presents with history of Lt shoulder pain for the past 6-8 months. He has limited shoulder ROM; pain with resistive testing Lt shoulder flex/abd/ER; tightness and pain with palpation through the Lt shoulder girdle; poor posture and alignment; abnormal muscular balance through shoulder girdle and UE; limited functional activity level related to above. He will benefit form PT to address problems identified and progress with improving functional activity level.    Pt will benefit from skilled therapeutic intervention in order to improve on the following deficits Postural dysfunction;Improper body mechanics;Decreased range of motion;Decreased mobility;Decreased strength;Pain;Decreased endurance;Decreased activity tolerance   Rehab Potential Good   PT Frequency 2x / week   PT Duration 6 weeks   PT Treatment/Interventions Patient/family education;ADLs/Self Care Home Management;Therapeutic exercise;Therapeutic activities;Manual techniques;Dry needling;Cryotherapy;Electrical Stimulation;Moist Heat;Ultrasound   PT Next Visit Plan Work on posture and alignment; continue stretching for anteror shoulder structures; work on posterior shoulder girdle strength as posture is improved; add manual work through Gaffer   PT Home Exercise Plan improving posture and alignment; stretching; scap squeeze   Consulted and Agree with Plan of Care Patient         Problem List Patient Active Problem List   Diagnosis Date Noted  . Subclinical hypothyroidism 09/08/2015  . Prediabetes 09/08/2015  .  Obesity 09/08/2015  . Left shoulder pain 09/07/2015  . Left foot pain 09/07/2015  . Right thyroid nodule 09/07/2015  . Hyperlipidemia 10/17/2014  . Bilateral knee pain 06/07/2013  . Annual physical exam 05/09/2013  . Essential hypertension, benign 05/09/2013    Mickeal Daws Rober Minion PT, MPH 09/15/2015, 12:49 PM  [facility] [address] [place] [address] [place], [place], [address] Phone: [phone]   Fax:  [phone]  Name: Celestine Bougie MRN: [id number] Date of Birth: 1971/06/19

## 2015-09-17 ENCOUNTER — Ambulatory Visit (INDEPENDENT_AMBULATORY_CARE_PROVIDER_SITE_OTHER): Payer: 59 | Admitting: Physical Therapy

## 2015-09-17 DIAGNOSIS — Z7409 Other reduced mobility: Secondary | ICD-10-CM

## 2015-09-17 DIAGNOSIS — M25612 Stiffness of left shoulder, not elsewhere classified: Secondary | ICD-10-CM | POA: Diagnosis not present

## 2015-09-17 DIAGNOSIS — M25512 Pain in left shoulder: Secondary | ICD-10-CM | POA: Diagnosis not present

## 2015-09-17 DIAGNOSIS — R293 Abnormal posture: Secondary | ICD-10-CM | POA: Diagnosis not present

## 2015-09-17 NOTE — Patient Instructions (Signed)
  Side Pull: Double Arm   On back, knees bent, feet flat. Arms perpendicular to body, shoulder level, elbows straight but relaxed. Pull arms out to sides, elbows straight. Resistance band comes across collarbones, hands toward floor. Hold momentarily. Slowly return to starting position. Repeat __10_ times, 2-3 sets. Band color __yellow___   Elisabeth CaraSash  (statue of liberty- thumb up) On back, knees bent, feet flat, left hand on left hip, right hand above left. Pull right arm DIAGONALLY (hip to shoulder) across chest. Bring right arm along head toward floor. Hold momentarily. Slowly return to starting position. Repeat _10__ times, 2-3 sets. Do with left arm. Band color __yellow____   Shoulder Rotation: Double Arm   On back, knees bent, feet flat, elbows tucked at sides, bent 90, hands palms up. Pull hands apart and down toward floor, keeping elbows near sides. Hold momentarily. Slowly return to starting position. Repeat __10_ times, 2-3 sets. Band color _yellow_____    [facility] [address] [place] [address] [place], [place] [address]  [phone] (office) [phone] (fax)

## 2015-09-17 NOTE — Therapy (Signed)
[facility] [address] [place] [address] [place], [place], [address] Phone: [phone]   Fax:  [phone]  Physical Therapy Treatment  Patient Details  Name: Alan Gardner MRN: [id number] Date of Birth: Nov 02, 1971 Referring Provider: Dr Benjamin Stainhekkekandam  Encounter Date: 09/17/2015      PT End of Session - 09/17/15 1624    Visit Number 2   Number of Visits 12   Date for PT Re-Evaluation 10/27/15   PT Start Time 1620   PT Stop Time 1711   PT Time Calculation (min) 51 min   Activity Tolerance Patient tolerated treatment well      Past Medical History  Diagnosis Date  . Hypertension   . Hyperlipemia     Past Surgical History  Procedure Laterality Date  . Kidney stone surgery      There were no vitals filed for this visit.  Visit Diagnosis:  Abnormal posture  Stiffness of shoulder joint, left  Pain in shoulder region, left  Decreased mobility and endurance      Subjective Assessment - 09/17/15 1624    Subjective Pt reports he continues to have pain in ant Lt shoulder with abd/ ER, no pain at rest with arm at side. Has had some pain with doorway stretch.    Currently in Pain? Yes  only with abd/ ER to 90 deg   Pain Score 3    Pain Location Shoulder   Pain Orientation Left;Anterior   Pain Descriptors / Indicators Nagging;Dull            [facility] PT Assessment - 09/17/15 0001    Assessment   Medical Diagnosis Impingement Lt shoulder    Onset Date/Surgical Date 01/13/15   Hand Dominance Right            [facility] Adult PT Treatment/Exercise - 09/17/15 0001    Shoulder Exercises: Supine   Horizontal ABduction Strengthening;Both;10 reps;Theraband  2 sets   Theraband Level (Shoulder Horizontal ABduction) Level 1 (Yellow)   External Rotation Both;10 reps;Theraband   Theraband Level (Shoulder External Rotation) Level 1 (Yellow)   Other Supine Exercises D2 Flexion Sash yellow band x 10 reps x 2 sets (LUE only)   Shoulder Exercises: Standing   Other Standing Exercises scap squeeze with noodle 5 sec 10 reps; repeated with ER bilateral shoulders    Other Standing Exercises chin tuck x 5 sec x 10 reps    Shoulder Exercises: ROM/Strengthening   UBE (Upper Arm Bike) L1: 2 min forward / 2 min backward   Shoulder Exercises: Stretch   Other Shoulder Stretches Doorway 3 position x 30 sec x 2 reps each position.  Altered middle level stretch due to increased pain in Lt ant shoulder at biceps tendon (to unilateral stretch each side.)    Cryotherapy   Number Minutes Cryotherapy 12 Minutes   Cryotherapy Location Shoulder   Type of Cryotherapy Ice pack                PT Education - 09/17/15 1709    Education provided Yes   Education Details HEP   Person(s) Educated Patient   Methods Explanation;Handout   Comprehension Returned demonstration             PT Long Term Goals - 09/15/15 1239    PT LONG TERM GOAL #1   Title Increase shoulder ROM 5-10 degrees or more through Lt shoulder iin limited planes 10/27/15   Time 6   Period Weeks   Status New   PT LONG  TERM GOAL #2   Title 5/5 painfree strength Lt shoulder 10/27/15    Time 6   Period Weeks   Status New   PT LONG TERM GOAL #3   Title Improve posture and alignment with improved scapular position and shoulder in anatomically improved position for movement 10/27/15   Time 6   Period Weeks   Status New   PT LONG TERM GOAL #4   Title Decrease pain to 0-2/10 with functional activities 10/27/15   Time 6   Period Weeks   Status New   PT LONG TERM GOAL #5   Title Improve FOTO to </= 26% limitaiion 10/27/15   Time 6   Period Weeks   Status New   Additional Long Term Goals   Additional Long Term Goals Yes   PT LONG TERM GOAL #6   Title Astor I in HEP for discharge 10/27/15   Time 6   Period Weeks   Status New               Plan - 09/17/15 1644    Clinical Impression Statement Pt reported increased pain in Lt anterior  shoulder with middle doorway stretch; pain eliminated with altering to unilateral position for that stretch. Pt tolerated all other exercises wihtou increased pain.  Pt will benefit from continued PT intervention to maiximize function and decrease pain.    Pt will benefit from skilled therapeutic intervention in order to improve on the following deficits Postural dysfunction;Improper body mechanics;Decreased range of motion;Decreased mobility;Decreased strength;Pain;Decreased endurance;Decreased activity tolerance   Rehab Potential Good   PT Frequency 2x / week   PT Duration 6 weeks   PT Treatment/Interventions Patient/family education;ADLs/Self Care Home Management;Therapeutic exercise;Therapeutic activities;Manual techniques;Dry needling;Cryotherapy;Electrical Stimulation;Moist Heat;Ultrasound   PT Next Visit Plan Continue postural and post shoulder girdle strengthening, chest stretches. Review HEP.    Consulted and Agree with Plan of Care Patient        Problem List Patient Active Problem List   Diagnosis Date Noted  . Subclinical hypothyroidism 09/08/2015  . Prediabetes 09/08/2015  . Obesity 09/08/2015  . Left shoulder pain 09/07/2015  . Left foot pain 09/07/2015  . Right thyroid nodule 09/07/2015  . Hyperlipidemia 10/17/2014  . Bilateral knee pain 06/07/2013  . Annual physical exam 05/09/2013  . Essential hypertension, benign 05/09/2013    Mayer Camel, PTA 09/17/2015 5:26 PM  [facility] [place] [address] [place] [address] [place], [place], [address] Phone: [phone]   Fax:  [phone]  Name: Oda Lansdowne MRN: [id number] Date of Birth: Aug 01, 1971

## 2015-09-18 NOTE — Telephone Encounter (Signed)
Received fax from [employer] and phentermine has been approved until 01/13/207 or plans limit coverage runs out. File ID [id number] pharmacy has been notified. - CF

## 2015-09-21 ENCOUNTER — Ambulatory Visit (INDEPENDENT_AMBULATORY_CARE_PROVIDER_SITE_OTHER): Payer: 59 | Admitting: Physical Therapy

## 2015-09-21 DIAGNOSIS — M25612 Stiffness of left shoulder, not elsewhere classified: Secondary | ICD-10-CM | POA: Diagnosis not present

## 2015-09-21 DIAGNOSIS — M25512 Pain in left shoulder: Secondary | ICD-10-CM | POA: Diagnosis not present

## 2015-09-21 DIAGNOSIS — R293 Abnormal posture: Secondary | ICD-10-CM | POA: Diagnosis not present

## 2015-09-21 DIAGNOSIS — Z7409 Other reduced mobility: Secondary | ICD-10-CM

## 2015-09-21 NOTE — Therapy (Signed)
[place], [place], [address] Phone: [phone]   Fax:  [phone]  Physical Therapy Treatment  Patient Details  Name: Alan Gardner MRN: [id number] Date of Birth: [birth year] Referring Provider: Dr Dianah Field  Encounter Date: 09/21/2015      PT End of Session - 09/21/15 1414    Visit Number 3   Number of Visits 12   Date for PT Re-Evaluation 10/27/15   PT Start Time 1410   PT Stop Time 1501   PT Time Calculation (min) 51 min   Activity Tolerance Patient tolerated treatment well      Past Medical History  Diagnosis Date  . Hypertension   . Hyperlipemia     Past Surgical History  Procedure Laterality Date  . Kidney stone surgery      There were no vitals filed for this visit.  Visit Diagnosis:  Abnormal posture  Stiffness of shoulder joint, left  Pain in shoulder region, left  Decreased mobility and endurance      Subjective Assessment - 09/21/15 1427    Subjective Pt reports the pain in Lt shoulder has lessened with abd/ER - now a 1-2/10; still painfree with arm at side at rest. Has been performing HEP 2x/.day.,    Currently in Pain? No/denies            [facility] PT Assessment - 09/21/15 0001    Assessment   Medical Diagnosis Impingement Lt shoulder    Onset Date/Surgical Date 01/13/15   Hand Dominance Right   Next MD Visit 10/05/15   AROM   Right Shoulder External Rotation 76 Degrees   Left Shoulder Flexion 172 Degrees  supine   Left Shoulder External Rotation 72 Degrees           [facility] Adult PT Treatment/Exercise - 09/21/15 0001    Shoulder Exercises: Supine   Horizontal ABduction Strengthening;Both;10 reps;Theraband  2 sets   Theraband Level (Shoulder Horizontal ABduction) Level 1 (Yellow)   External Rotation Both;10 reps;Theraband  2 sets   Theraband Level (Shoulder External Rotation) Level 1 (Yellow)   Flexion Strengthening;Both;10 reps;Theraband   multiple cues for form and breathing    Theraband Level (Shoulder Flexion) Level 1 (Yellow)   Other Supine Exercises D2 Flexion Sash yellow band x 10 reps x 2 sets   Shoulder Exercises: Standing   Row Strengthening;Both;12 reps   Theraband Level (Shoulder Row) Level 2 (Red)   Row Limitations (required tactile cues for form)    Shoulder Exercises: ROM/Strengthening   UBE (Upper Arm Bike) L3: 2 min forward / 2 min backward   Shoulder Exercises: Stretch   Other Shoulder Stretches Doorway stretch:  mid level unilateral (required tactile cues for form) and high level x 30 sec each x 2 reps x 2 sets    Cryotherapy   Number Minutes Cryotherapy 12 Minutes   Cryotherapy Location Shoulder   Type of Cryotherapy Ice pack                     PT Long Term Goals - 09/21/15 1457    PT LONG TERM GOAL #1   Title Increase shoulder ROM 5-10 degrees or more through Lt shoulder iin limited planes 10/27/15   Time 6   Period Weeks   Status Achieved   PT LONG TERM GOAL #2   Title 5/5 painfree strength Lt shoulder 10/27/15    Time 6   Period Weeks   Status On-going   PT LONG  TERM GOAL #3   Title Improve posture and alignment with improved scapular position and shoulder in anatomically improved position for movement 10/27/15   Time 6   Period Weeks   Status On-going   PT LONG TERM GOAL #4   Title Decrease pain to 0-2/10 with functional activities 10/27/15   Time 6   Period Weeks   Status On-going   PT LONG TERM GOAL #5   Title Improve FOTO to </= 26% limitaiion 10/27/15   Time 6   Period Weeks   Status On-going   PT LONG TERM GOAL #6   Title [place] I in [place] for discharge 10/27/15   Time 6   Period Weeks   Status On-going               Plan - 09/21/15 1429    Clinical Impression Statement Pt required some cues for breathing during exertion of exercises, also some tactile cues for form. Pt demonstrated improved shoulder ROM since last assessment. Pt tolerated all exercises  with no increase in pain, just muscular fatigue.  Progressing towards goals- has met LTG #1    Pt will benefit from skilled therapeutic intervention in order to improve on the following deficits Postural dysfunction;Improper body mechanics;Decreased range of motion;Decreased mobility;Decreased strength;Pain;Decreased endurance;Decreased activity tolerance   Rehab Potential Good   PT Frequency 2x / week   PT Duration 6 weeks   PT Treatment/Interventions Patient/family education;ADLs/Self Care Home Management;Therapeutic exercise;Therapeutic activities;Manual techniques;Dry needling;Cryotherapy;Electrical Stimulation;Moist Heat;Ultrasound   PT Next Visit Plan Continue postural and post shoulder girdle strengthening, chest stretches.   Consulted and Agree with Plan of Care Patient        Problem List Patient Active Problem List   Diagnosis Date Noted  . Subclinical hypothyroidism 09/08/2015  . Prediabetes 09/08/2015  . Obesity 09/08/2015  . Left shoulder pain 09/07/2015  . Left foot pain 09/07/2015  . Right thyroid nodule 09/07/2015  . Hyperlipidemia 10/17/2014  . Bilateral knee pain 06/07/2013  . Annual physical exam 05/09/2013  . Essential hypertension, benign 05/09/2013   Kerin Perna, PTA 09/21/2015 2:57 PM  [place], [place], [address] Phone: [phone]   Fax:  [phone]  Name: Damante Spragg MRN: [id number] Date of Birth: 03-05-1971

## 2015-09-23 DIAGNOSIS — I10 Essential (primary) hypertension: Secondary | ICD-10-CM

## 2015-09-23 DIAGNOSIS — E1159 Type 2 diabetes mellitus with other circulatory complications: Secondary | ICD-10-CM

## 2015-09-23 DIAGNOSIS — E669 Obesity, unspecified: Secondary | ICD-10-CM | POA: Insufficient documentation

## 2015-09-23 DIAGNOSIS — E1169 Type 2 diabetes mellitus with other specified complication: Secondary | ICD-10-CM

## 2015-09-24 ENCOUNTER — Ambulatory Visit (INDEPENDENT_AMBULATORY_CARE_PROVIDER_SITE_OTHER): Payer: 59 | Admitting: Rehabilitative and Restorative Service Providers"

## 2015-09-24 ENCOUNTER — Encounter: Payer: Self-pay | Admitting: Rehabilitative and Restorative Service Providers"

## 2015-09-24 DIAGNOSIS — M25512 Pain in left shoulder: Secondary | ICD-10-CM

## 2015-09-24 DIAGNOSIS — R293 Abnormal posture: Secondary | ICD-10-CM

## 2015-09-24 DIAGNOSIS — Z7409 Other reduced mobility: Secondary | ICD-10-CM

## 2015-09-24 DIAGNOSIS — M25612 Stiffness of left shoulder, not elsewhere classified: Secondary | ICD-10-CM | POA: Diagnosis not present

## 2015-09-24 NOTE — Therapy (Signed)
[facility] [place] [address] [place], [place], [address] Phone: [phone]   Fax:  [phone]  Physical Therapy Treatment  Patient Details  Name: Alan Gardner MRN: [id number] Date of Birth: 1971-06-27 Referring Provider: Dr Benjamin Stain  Encounter Date: 09/24/2015      PT End of Session - 09/24/15 1451    Visit Number 4   Number of Visits 12   Date for PT Re-Evaluation 10/27/15   PT Start Time 1449   PT Stop Time 1550   PT Time Calculation (min) 61 min   Activity Tolerance Patient tolerated treatment well      Past Medical History  Diagnosis Date  . Hypertension   . Hyperlipemia     Past Surgical History  Procedure Laterality Date  . Kidney stone surgery      There were no vitals filed for this visit.  Visit Diagnosis:  Abnormal posture  Stiffness of shoulder joint, left  Pain in shoulder region, left  Decreased mobility and endurance      Subjective Assessment - 09/24/15 1452    Subjective Continued improvement in shoulder symptoms. Pain is not as less frequent and less intense. Only occurs if he moves his arm a certain way - ER in abducted position   Currently in Pain? No/denies                         [facility] Adult PT Treatment/Exercise - 09/24/15 0001    Shoulder Exercises: Standing   Extension Strengthening;Both;15 reps;Theraband   Theraband Level (Shoulder Extension) Level 2 (Red)   Row Strengthening;Both;15 reps;Theraband   Theraband Level (Shoulder Row) Level 2 (Red)   Retraction Strengthening;Both;20 reps;Theraband  with swim noodle   Theraband Level (Shoulder Retraction) Level 1 (Yellow)   Other Standing Exercises scap squeeze with noodle 10 sec 10 reps; repeated with ER bilateral shoulders    Other Standing Exercises chin tuck x 5 sec x 10 reps    Shoulder Exercises: Therapy Ball   Flexion Limitations stepping under large ball for flexion stretch 20 sec hold x3 reps     Shoulder Exercises: ROM/Strengthening   UBE (Upper Arm Bike) L3: 2 min forward / 2 min backward   Shoulder Exercises: Stretch   Corner Stretch Limitations doorway 3 positions 30 sec hold 3 reps each position   Other Shoulder Stretches doorway - lower level modified for Lt arm only    Other Shoulder Stretches snow angle prolonged stretch with noodle vertically and horizontally T3/4/5 area - myofacila release ball at same area fro supine stretch    Cryotherapy   Number Minutes Cryotherapy 18 Minutes   Cryotherapy Location Shoulder   Type of Cryotherapy Ice pack   Electrical Stimulation   Electrical Stimulation Location ant shoulder pec insertion   Electrical Stimulation Action IFC   Electrical Stimulation Parameters to tolerance   Electrical Stimulation Goals Pain;Other (comment)   Manual Therapy   Joint Mobilization thoracic mobs sitting some tenderness T3/4/5 area bilat with PA and lateral mobs    Soft tissue mobilization working through the pecs Lt with deep tissue work/release                 PT Education - 09/24/15 1543    Education provided Yes   Education Details education re the importance of posture and alignment and stretching pecs; issued red TB and HEP    Person(s) Educated Patient   Methods Explanation;Demonstration;Tactile cues;Verbal cues;Handout   Comprehension Verbalized understanding;Returned  demonstration;Verbal cues required;Tactile cues required             PT Long Term Goals - 09/21/15 1457    PT LONG TERM GOAL #1   Title Increase shoulder ROM 5-10 degrees or more through Lt shoulder iin limited planes 10/27/15   Time 6   Period Weeks   Status Achieved   PT LONG TERM GOAL #2   Title 5/5 painfree strength Lt shoulder 10/27/15    Time 6   Period Weeks   Status On-going   PT LONG TERM GOAL #3   Title Improve posture and alignment with improved scapular position and shoulder in anatomically improved position for movement 10/27/15   Time 6    Period Weeks   Status On-going   PT LONG TERM GOAL #4   Title Decrease pain to 0-2/10 with functional activities 10/27/15   Time 6   Period Weeks   Status On-going   PT LONG TERM GOAL #5   Title Improve FOTO to </= 26% limitaiion 10/27/15   Time 6   Period Weeks   Status On-going   PT LONG TERM GOAL #6   Title [place] I in HEP for discharge 10/27/15   Time 6   Period Weeks   Status On-going               Plan - 09/24/15 1547    Clinical Impression Statement Good response to mobilization and manual work with pt reporting that he felt "a lot looser". Note significant tightness through the pecs Lt >>Rt. Continues to have rounded posture with scapulae abducted and rotated along the thoracic wall.    Pt will benefit from skilled therapeutic intervention in order to improve on the following deficits Postural dysfunction;Improper body mechanics;Decreased range of motion;Decreased mobility;Decreased strength;Pain;Decreased endurance;Decreased activity tolerance   Rehab Potential Good   PT Frequency 2x / week   PT Duration 6 weeks   PT Treatment/Interventions Patient/family education;ADLs/Self Care Home Management;Therapeutic exercise;Therapeutic activities;Manual techniques;Dry needling;Cryotherapy;Electrical Stimulation;Moist Heat;Ultrasound   PT Next Visit Plan Continue postural and post shoulder girdle strengthening, chest stretches.   PT Home Exercise Plan improving posture and alignment; stretching; scap squeeze   Consulted and Agree with Plan of Care Patient        Problem List Patient Active Problem List   Diagnosis Date Noted  . Subclinical hypothyroidism 09/08/2015  . Prediabetes 09/08/2015  . Obesity 09/08/2015  . Left shoulder pain 09/07/2015  . Left foot pain 09/07/2015  . Right thyroid nodule 09/07/2015  . Hyperlipidemia 10/17/2014  . Bilateral knee pain 06/07/2013  . Annual physical exam 05/09/2013  . Essential hypertension, benign 05/09/2013    [place] Rober Minion  Danisha Brassfield PT,MPH 09/24/2015, 3:50 PM  [facility] [address] [place], [place], [address] Phone: [phone]   Fax:  [phone]  Name: Violet Baldyaul Lafferty MRN: [id number] Date of Birth: 26-Jul-1971

## 2015-09-24 NOTE — Patient Instructions (Signed)
Resisted External Rotation: in Neutral - Bilateral   PALMS UP Sit or stand, tubing in both hands, elbows at sides, bent to 90, forearms forward. Pinch shoulder blades together and rotate forearms out. Keep elbows at sides. Repeat __10__ times per set. Do _2-3___ sets per session. Do _2-3___ sessions per day.   Low Row: Standing   Face anchor, feet shoulder width apart. Palms up, pull arms back, squeezing shoulder blades together. Repeat 10__ times per set. Do 2-3__ sets per session. Do 2-3__ sessions per week. Anchor Height: Waist   Strengthening: Resisted Extension   Hold tubing in right hand, arm forward. Pull arm back, elbow straight. Repeat _10___ times per set. Do 2-3____ sets per session. Do 2-3____ sessions per day.   Lying on back arms out to side - place swim noodle along the spine lengthwise and across your spine Or you can use ball in the center of the spine between shoulder blades Arms out to side - hold 3-5 minutes

## 2015-09-28 ENCOUNTER — Ambulatory Visit (INDEPENDENT_AMBULATORY_CARE_PROVIDER_SITE_OTHER): Payer: 59 | Admitting: Physical Therapy

## 2015-09-28 DIAGNOSIS — R293 Abnormal posture: Secondary | ICD-10-CM

## 2015-09-28 DIAGNOSIS — Z7409 Other reduced mobility: Secondary | ICD-10-CM

## 2015-09-28 DIAGNOSIS — M25612 Stiffness of left shoulder, not elsewhere classified: Secondary | ICD-10-CM | POA: Diagnosis not present

## 2015-09-28 NOTE — Therapy (Signed)
[place], [place], [address] Phone: [phone]   Fax:  [phone]  Physical Therapy Treatment  Patient Details  Name: Alan Gardner MRN: [id number] Date of Birth: 25-Jan-1971 Referring Provider: Dr Dianah Field  Encounter Date: 09/28/2015      PT End of Session - 09/28/15 0931    Visit Number 5   Number of Visits 12   Date for PT Re-Evaluation 10/27/15   PT Start Time 0928   PT Stop Time 1016   PT Time Calculation (min) 48 min   Activity Tolerance Patient tolerated treatment well;No increased pain      Past Medical History  Diagnosis Date  . Hypertension   . Hyperlipemia     Past Surgical History  Procedure Laterality Date  . Kidney stone surgery      There were no vitals filed for this visit.  Visit Diagnosis:  Abnormal posture  Stiffness of shoulder joint, left  Decreased mobility and endurance      Subjective Assessment - 09/28/15 0931    Subjective Pt reports he no longer has pain in anterior Lt shoulder with abd/ER.  "I didn't realize how the rolling in of the shoulder played such a big part of the problem".    Currently in Pain? No/denies            [facility] PT Assessment - 09/28/15 0001    Assessment   Medical Diagnosis Impingement Lt shoulder    Onset Date/Surgical Date 01/13/15   Hand Dominance Right   Next MD Visit 10/05/15           [facility] Adult PT Treatment/Exercise - 09/28/15 0001    Shoulder Exercises: Supine   Horizontal ABduction Strengthening;Both;10 reps;Theraband   Theraband Level (Shoulder Horizontal ABduction) Level 3 (Green);Level 2 (Red)  1 set with each band   External Rotation Both;10 reps  1 set with each band   Theraband Level (Shoulder External Rotation) Level 3 (Green);Level 2 (Red)   Flexion Right;Left;10 reps  hooklying on full foam roll   Shoulder Flexion Weight (lbs) 2   Other Supine Exercises Hooklying on full foam roller:   with BUE horiz abd ~2 min, then snow angels x 5 slow reps, chin tucks with 5 sec hold x 10 reps.    Other Supine Exercises D2 flexion with red band x 10 reps , with green band x 10 reps   Shoulder Exercises: Standing   Extension Strengthening;Both;10 reps;Theraband   Theraband Level (Shoulder Extension) Level 3 (Green)   Row Strengthening;Both;10 reps;Theraband   Theraband Level (Shoulder Row) Level 3 (Green)   Shoulder Exercises: ROM/Strengthening   UBE (Upper Arm Bike) L4: 2 min forward / 2 min backward   Shoulder Exercises: Stretch   Corner Stretch Limitations doorway 3 positions 30 sec hold 3 reps each position  2 sets (beginning/end of session)   Other Shoulder Stretches doorway - lower level modified for Lt arm only    Cryotherapy   Number Minutes Cryotherapy 12 Minutes   Cryotherapy Location Shoulder   Type of Cryotherapy Ice pack                PT Education - 09/28/15 5284    Education provided Yes   Education Details HEP- pt to perform band exercises with green band every other day( supine), stretches every day.    Person(s) Educated Patient   Methods Explanation;Demonstration   Comprehension Verbalized understanding;Returned demonstration  PT Long Term Goals - 09/28/15 0944    PT LONG TERM GOAL #1   Title Increase shoulder ROM 5-10 degrees or more through Lt shoulder iin limited planes 10/27/15   Time 6   Period Weeks   Status Achieved   PT LONG TERM GOAL #2   Title 5/5 painfree strength Lt shoulder 10/27/15    Time 6   Period Weeks   Status On-going   PT LONG TERM GOAL #3   Title Improve posture and alignment with improved scapular position and shoulder in anatomically improved position for movement 10/27/15   Time 6   Period Weeks   Status On-going   PT LONG TERM GOAL #4   Title Decrease pain to 0-2/10 with functional activities 10/27/15   Time 6   Period Weeks   Status Achieved   PT LONG TERM GOAL #5   Title Improve FOTO to </=  26% limitaiion 10/27/15   Time 6   Period Weeks   Status On-going   PT LONG TERM GOAL #6   Title [place] I in [place] for discharge 10/27/15   Time 6   Period Weeks   Status On-going               Plan - 09/28/15 0947    Clinical Impression Statement Pt tolerated new exercises without increase/production of pain in Lt shoulder. Has met LTG# 4, making good progress towards remaining goals.    Pt will benefit from skilled therapeutic intervention in order to improve on the following deficits Postural dysfunction;Improper body mechanics;Decreased range of motion;Decreased mobility;Decreased strength;Pain;Decreased endurance;Decreased activity tolerance   Rehab Potential Good   PT Frequency 2x / week   PT Duration 6 weeks   PT Treatment/Interventions Patient/family education;ADLs/Self Care Home Management;Therapeutic exercise;Therapeutic activities;Manual techniques;Dry needling;Cryotherapy;Electrical Stimulation;Moist Heat;Ultrasound   PT Next Visit Plan Continue postural and post shoulder girdle strengthening, chest stretches.  Assess strength and Lt shoulder ROM.    Consulted and Agree with Plan of Care Patient        Problem List Patient Active Problem List   Diagnosis Date Noted  . Subclinical hypothyroidism 09/08/2015  . Prediabetes 09/08/2015  . Obesity 09/08/2015  . Left shoulder pain 09/07/2015  . Left foot pain 09/07/2015  . Right thyroid nodule 09/07/2015  . Hyperlipidemia 10/17/2014  . Bilateral knee pain 06/07/2013  . Annual physical exam 05/09/2013  . Essential hypertension, benign 05/09/2013   Kerin Perna, PTA 09/28/2015 10:10 AM  [place], [place], [address] Phone: [phone]   Fax:  [phone]  Name: Klinton Candelas MRN: [id number] Date of Birth: Apr 04, 1971

## 2015-09-29 ENCOUNTER — Ambulatory Visit
Admission: RE | Admit: 2015-09-29 | Discharge: 2015-09-29 | Disposition: A | Discharge status: Home / Self Care | Payer: 59 | Source: Ambulatory Visit | Attending: Sports Medicine | Admitting: Sports Medicine

## 2015-09-29 ENCOUNTER — Other Ambulatory Visit (HOSPITAL_COMMUNITY)
Admission: RE | Admit: 2015-09-29 | Discharge: 2015-09-29 | Disposition: A | Discharge status: Home / Self Care | Payer: 59 | Source: Ambulatory Visit | Attending: Radiology | Admitting: Radiology

## 2015-09-29 DIAGNOSIS — E041 Nontoxic single thyroid nodule: Secondary | ICD-10-CM

## 2015-10-01 ENCOUNTER — Ambulatory Visit (INDEPENDENT_AMBULATORY_CARE_PROVIDER_SITE_OTHER): Payer: 59 | Admitting: Physical Therapy

## 2015-10-01 DIAGNOSIS — M25612 Stiffness of left shoulder, not elsewhere classified: Secondary | ICD-10-CM | POA: Diagnosis not present

## 2015-10-01 DIAGNOSIS — R293 Abnormal posture: Secondary | ICD-10-CM

## 2015-10-01 DIAGNOSIS — Z7409 Other reduced mobility: Secondary | ICD-10-CM | POA: Diagnosis not present

## 2015-10-01 DIAGNOSIS — M25512 Pain in left shoulder: Secondary | ICD-10-CM

## 2015-10-01 NOTE — Patient Instructions (Signed)
Abduction: Scaption - Thumb Up (Dumbbell)    Lift arms out from sides, thumbs up. One arm at a time- with yellow band  Repeat _10___ times per set. Do __2__ sets per session. Do __3__ sessions per week.  Strengthening: Resisted External Rotation    Hold tubing in left hand, elbow at side (WITH TOWEL TUCKED) Rotate fist out. Repeat __10_ times per set. Do __2__ sets per session. Do _3__ sessions per week.  Shoulder Flexion: Standing - Diagonal 2 (Single Arm)    In shoulder width stance, anchor tubing at hip opposite exercising arm. Thumb in, arm across body, pull forward and up, rotating to thumb back. Repeat 10__ times per set. Repeat with other arm. Do _2_ sets per session. Do _3_ sessions per week. YELLOW. Copyright  VHI. All rights reserved.   [facility] [address] [place], [place] [address]  [phone] (office) [phone] (fax)

## 2015-10-01 NOTE — Therapy (Signed)
[place], [place], [address] Phone: [phone]   Fax:  [phone]  Physical Therapy Treatment  Patient Details  Name: Alan Gardner MRN: [id number] Date of Birth: 1970/11/30 Referring Provider: Dr Dianah Field  Encounter Date: 10/01/2015      PT End of Session - 10/01/15 1454    Visit Number 6   Number of Visits 12   Date for PT Re-Evaluation 10/27/15   PT Start Time 7591   PT Stop Time 1537   PT Time Calculation (min) 48 min   Activity Tolerance Patient tolerated treatment well      Past Medical History  Diagnosis Date  . Hypertension   . Hyperlipemia     Past Surgical History  Procedure Laterality Date  . Kidney stone surgery      There were no vitals filed for this visit.  Visit Diagnosis:  Abnormal posture  Stiffness of shoulder joint, left  Decreased mobility and endurance  Pain in shoulder region, left      Subjective Assessment - 10/01/15 1454    Subjective Pt reports his Lt shoulder has not been painful with sleeping on LUE, nor working overhead, as it had prior to therapy.     Currently in Pain? No/denies            [facility] PT Assessment - 10/01/15 0001    Assessment   Medical Diagnosis Impingement Lt shoulder    Onset Date/Surgical Date 01/13/15   Hand Dominance Right   Next MD Visit 10/05/15   AROM   Right Shoulder External Rotation 82 Degrees   Left Shoulder Extension 71 Degrees   Left Shoulder Flexion 172 Degrees  supine   Left Shoulder Internal Rotation 56 Degrees   Left Shoulder External Rotation 86 Degrees   Strength   Overall Strength Comments 5/5 bilat UE's    Strength Assessment Site Shoulder   Right/Left Shoulder Left   Left Shoulder Flexion 5/5   Left Shoulder Extension 5/5   Left Shoulder ABduction 5/5  with pain   Left Shoulder Internal Rotation 5/5   Left Shoulder External Rotation 5/5   Empty Can test   Findings Positive   Side  Left          [facility] Adult PT Treatment/Exercise - 10/01/15 0001    Shoulder Exercises: Standing   External Rotation Strengthening;Left;10 reps;Theraband;Right  2 sets   Theraband Level (Shoulder External Rotation) Level 2 (Red)   ABduction Left;10 reps;Theraband  (scaption)   Theraband Level (Shoulder ABduction) Level 1 (Yellow)   Extension Both;10 reps;Theraband   Theraband Level (Shoulder Extension) Level 3 (Green)   Row Strengthening;Both;10 reps;Theraband   Theraband Level (Shoulder Row) Level 3 (Green)   Other Standing Exercises D2 flexion with yellow band x 10 reps x 2 sets with LUE    Shoulder Exercises: ROM/Strengthening   UBE (Upper Arm Bike) L4: 2 min forward / 2 min backward  standing   Shoulder Exercises: Stretch   Corner Stretch Limitations doorway 3 positions 30 sec hold 3 reps each position  2 sets (beginning/end of session)   Other Shoulder Stretches doorway - lower level modified for Lt arm only    Cryotherapy   Number Minutes Cryotherapy 10 Minutes   Cryotherapy Location Shoulder   Type of Cryotherapy Ice pack                PT Education - 10/01/15 1557    Education provided Yes   Education Details HEP  Methods Handout;Demonstration;Explanation   Comprehension Verbalized understanding;Returned demonstration             PT Long Term Goals - 10/01/15 1511    PT LONG TERM GOAL #1   Title Increase shoulder ROM 5-10 degrees or more through Lt shoulder iin limited planes 10/27/15   Time 6   Period Weeks   Status Achieved   PT LONG TERM GOAL #2   Title 5/5 painfree strength Lt shoulder 10/27/15    Time 6   Period Weeks   Status Partially Met   PT LONG TERM GOAL #3   Title Improve posture and alignment with improved scapular position and shoulder in anatomically improved position for movement 10/27/15   Time 6   Period Weeks   Status On-going   PT LONG TERM GOAL #4   Title Decrease pain to 0-2/10 with functional activities 10/27/15    Time 6   Period Weeks   Status Achieved   PT LONG TERM GOAL #5   Title Improve FOTO to </= 26% limitaiion 10/27/15   Time 6   Period Weeks   Status On-going   PT LONG TERM GOAL #6   Title Pt I in HEP for discharge 10/27/15   Time 6   Period Weeks   Status On-going               Plan - 10/01/15 1521    Clinical Impression Statement Pt demonstrated improved Lt shoulder ER.  Pt continues with positive empty can.  Tolerated new exercises well, only slight increase in pain in left shoulder; minimized with use of icepack at end of session.  Pt progressing well towards established goals.    Pt will benefit from skilled therapeutic intervention in order to improve on the following deficits Postural dysfunction;Improper body mechanics;Decreased range of motion;Decreased mobility;Decreased strength;Pain;Decreased endurance;Decreased activity tolerance   Rehab Potential Good   PT Frequency 2x / week   PT Duration 6 weeks   PT Treatment/Interventions Patient/family education;ADLs/Self Care Home Management;Therapeutic exercise;Therapeutic activities;Manual techniques;Dry needling;Cryotherapy;Electrical Stimulation;Moist Heat;Ultrasound   PT Next Visit Plan Continue progressive strengthening to Lt rotatoer cuff and shoulder girdle, posture.    Consulted and Agree with Plan of Care Patient        Problem List Patient Active Problem List   Diagnosis Date Noted  . Obesity, diabetes, and hypertension syndrome ([place]) 09/23/2015  . Subclinical hypothyroidism 09/08/2015  . Prediabetes 09/08/2015  . Obesity 09/08/2015  . Left shoulder pain 09/07/2015  . Left foot pain 09/07/2015  . Right thyroid nodule 09/07/2015  . Hyperlipidemia 10/17/2014  . Bilateral knee pain 06/07/2013  . Annual physical exam 05/09/2013  . Essential hypertension, benign 05/09/2013   Kerin Perna, PTA 10/01/2015 3:58 PM  [place], [place], [address] Phone: [phone]   Fax:  [phone]  Name: Laurie Lovejoy MRN: [id number] Date of Birth: 09/14/1971

## 2015-10-05 ENCOUNTER — Ambulatory Visit (INDEPENDENT_AMBULATORY_CARE_PROVIDER_SITE_OTHER): Payer: 59 | Admitting: Sports Medicine

## 2015-10-05 ENCOUNTER — Encounter: Payer: Self-pay | Admitting: Sports Medicine

## 2015-10-05 VITALS — BP 125/83 | HR 71 | Temp 97.8°F | Resp 16 | Wt 276.4 lb

## 2015-10-05 DIAGNOSIS — E041 Nontoxic single thyroid nodule: Secondary | ICD-10-CM

## 2015-10-05 DIAGNOSIS — M25512 Pain in left shoulder: Secondary | ICD-10-CM | POA: Diagnosis not present

## 2015-10-05 DIAGNOSIS — M79672 Pain in left foot: Secondary | ICD-10-CM

## 2015-10-05 DIAGNOSIS — E039 Hypothyroidism, unspecified: Secondary | ICD-10-CM

## 2015-10-05 DIAGNOSIS — E038 Other specified hypothyroidism: Secondary | ICD-10-CM

## 2015-10-05 DIAGNOSIS — E669 Obesity, unspecified: Secondary | ICD-10-CM

## 2015-10-05 NOTE — Progress Notes (Signed)
  Subjective:    CC: Follow-up  HPI: Left subscapularis dysfunction: Resolved with physical therapy  Left foot pain: Better with orthotics  Subclinical hypothyroidism: Currently on levothyroxine 50 g.  Thyroid nodule: Biopsy turned out to be negative.  Obesity: 15 pound weight loss after one month, desires to continue weight loss treatment with Dr. Cathey EndowBowen  Past medical history, Surgical history, Family history not pertinant except as noted below, Social history, Allergies, and medications have been entered into the medical record, reviewed, and no changes needed.   Review of Systems: No fevers, chills, night sweats, weight loss, chest pain, or shortness of breath.   Objective:    General: Well Developed, well nourished, and in no acute distress.  Neuro: Alert and oriented x3, extra-ocular muscles intact, sensation grossly intact.  HEENT: Normocephalic, atraumatic, pupils equal round reactive to light, neck supple, no masses, no lymphadenopathy, thyroid nonpalpable.  Skin: Warm and dry, no rashes. Cardiac: Regular rate and rhythm, no murmurs rubs or gallops, no lower extremity edema.  Respiratory: Clear to auscultation bilaterally. Not using accessory muscles, speaking in full sentences.  Impression and Recommendations:

## 2015-10-05 NOTE — Assessment & Plan Note (Signed)
Noted nodule on exam, ultrasound was concerning, but subsequent biopsy showed normal benign thyroid tissue.

## 2015-10-05 NOTE — Assessment & Plan Note (Signed)
Subscapularis dysfunction resolved with physical therapy.

## 2015-10-05 NOTE — Assessment & Plan Note (Signed)
Doing well with custom orthotics, he may come back for another pair.

## 2015-10-05 NOTE — Assessment & Plan Note (Signed)
15 pound weight loss after the first 3 weeks on phentermine, further follow-up with Dr. Cathey EndowBowen.

## 2015-10-05 NOTE — Assessment & Plan Note (Signed)
We will probably recheck TSH in a few months.

## 2015-10-06 ENCOUNTER — Ambulatory Visit (INDEPENDENT_AMBULATORY_CARE_PROVIDER_SITE_OTHER): Payer: 59 | Admitting: Physical Therapy

## 2015-10-06 DIAGNOSIS — R293 Abnormal posture: Secondary | ICD-10-CM

## 2015-10-06 DIAGNOSIS — M25512 Pain in left shoulder: Secondary | ICD-10-CM

## 2015-10-06 DIAGNOSIS — M25612 Stiffness of left shoulder, not elsewhere classified: Secondary | ICD-10-CM | POA: Diagnosis not present

## 2015-10-06 DIAGNOSIS — Z7409 Other reduced mobility: Secondary | ICD-10-CM

## 2015-10-06 NOTE — Therapy (Addendum)
[place], [place], [address] Phone: [phone]   Fax:  [phone]  Physical Therapy Treatment  Patient Details  Name: Alan Gardner MRN: [id number] Date of Birth: 1971-06-30 Referring Provider: Dr Dianah Field  Encounter Date: 10/06/2015      PT End of Session - 10/06/15 0934    Visit Number 7   Number of Visits 12   Date for PT Re-Evaluation 10/27/15   PT Start Time 0932   PT Stop Time 8546   PT Time Calculation (min) 57 min      Past Medical History  Diagnosis Date  . Hypertension   . Hyperlipemia     Past Surgical History  Procedure Laterality Date  . Kidney stone surgery      There were no vitals filed for this visit.  Visit Diagnosis:  Abnormal posture  Stiffness of shoulder joint, left  Decreased mobility and endurance  Pain in shoulder region, left          [facility] PT Assessment - 10/06/15 0001    Observation/Other Assessments   Focus on Therapeutic Outcomes (FOTO)  7% limited   Strength   Right/Left Shoulder Left   Left Shoulder Flexion 5/5   Left Shoulder Extension 5/5   Left Shoulder ABduction 5/5   Left Shoulder Internal Rotation 5/5   Left Shoulder External Rotation 5/5   Empty Can test   Findings Negative   Side Left          [facility] Adult PT Treatment/Exercise - 10/06/15 0001    Exercises   Exercises Shoulder   Shoulder Exercises: Prone   Other Prone Exercises scap retraction with goal post position arms and axial extension x 10 reps    Other Prone Exercises childs pose x 3 reps    Shoulder Exercises: Standing   External Rotation Strengthening;Left;10 reps;Theraband   Theraband Level (Shoulder External Rotation) Level 3 (Green)   ABduction Left;10 reps;Theraband;Right  (scaption) 2 sets   Theraband Level (Shoulder ABduction) Level 2 (Red)   Extension Strengthening;Both;10 reps   Theraband Level (Shoulder Extension) Level 3 (Green)  2 sets   Row Strengthening;Both;20 reps   Theraband Level (Shoulder Row) Level 3 (Green)   Other Standing Exercises D2 flexion with green band x 10 reps with LUE /RUE   Shoulder Exercises: ROM/Strengthening   UBE (Upper Arm Bike) L4: 2 min forward / 2 min backward  standing   Shoulder Exercises: Stretch   Corner Stretch Limitations doorway 3 positions 30 sec hold 3 reps each position  2 sets (beginning/end of session)   Cross Chest Stretch 2 reps;30 seconds  each side   Cryotherapy   Number Minutes Cryotherapy 12 Minutes   Cryotherapy Location Shoulder   Type of Cryotherapy Ice pack           PT Long Term Goals - 10/06/15 1005    PT LONG TERM GOAL #1   Title Increase shoulder ROM 5-10 degrees or more through Lt shoulder iin limited planes 10/27/15   Time 6   Period Weeks   Status Achieved   PT LONG TERM GOAL #2   Title 5/5 painfree strength Lt shoulder 10/27/15    Time 6   Period Weeks   Status Achieved   PT LONG TERM GOAL #3   Title Improve posture and alignment with improved scapular position and shoulder in anatomically improved position for movement 10/27/15   Time 6   Period Weeks   Status Achieved  with verbal  cues   PT LONG TERM GOAL #4   Title Decrease pain to 0-2/10 with functional activities 10/27/15   Time 6   Period Weeks   Status Achieved   PT LONG TERM GOAL #6   Title Pt I in HEP for discharge 10/27/15   Time 6   Period Weeks   Status Achieved               Plan - 10/06/15 1039    Clinical Impression Statement Pt has met all goals and is pleased with current level of function.  Pt agreeable to d/c to advanced HEP.    Pt will benefit from skilled therapeutic intervention in order to improve on the following deficits Postural dysfunction;Improper body mechanics;Decreased range of motion;Decreased mobility;Decreased strength;Pain;Decreased endurance;Decreased activity tolerance   Rehab Potential Good   PT Frequency 2x / week   PT Duration 6 weeks   PT  Treatment/Interventions Patient/family education;ADLs/Self Care Home Management;Therapeutic exercise;Therapeutic activities;Manual techniques;Dry needling;Cryotherapy;Electrical Stimulation;Moist Heat;Ultrasound   PT Next Visit Plan Spoke to supervising PT; will d/c to HEP.    Consulted and Agree with Plan of Care Patient        Problem List Patient Active Problem List   Diagnosis Date Noted  . Obesity, diabetes, and hypertension syndrome ([place]) 09/23/2015  . Subclinical hypothyroidism 09/08/2015  . Prediabetes 09/08/2015  . Obesity 09/08/2015  . Left shoulder pain 09/07/2015  . Left foot pain 09/07/2015  . Right thyroid nodule 09/07/2015  . Hyperlipidemia 10/17/2014  . Bilateral knee pain 06/07/2013  . Annual physical exam 05/09/2013  . Essential hypertension, benign 05/09/2013    Kerin Perna, PTA 10/06/2015 11:09 AM  [place], [place], [address] Phone: [phone]   Fax:  [phone]  Name: Alan Gardner MRN: [id number] Date of Birth: 1971-11-12    PHYSICAL THERAPY DISCHARGE SUMMARY  Visits from Start of Care: 7  Current functional level related to goals / functional outcomes: Goals of therapy accomplished. Pt I with HEP and has returned to all normal functional activities.    Remaining deficits: Needs to continue with HEP   Education / Equipment: HEP/theraband  Plan: Patient agrees to discharge.  Patient goals were met. Patient is being discharged due to meeting the stated rehab goals.  ?????   Celyn P. Helene Kelp PT, MPH 10/19/2015 1:51 PM

## 2015-10-06 NOTE — Patient Instructions (Addendum)
Scapular Retraction: Abduction (Prone)    Lie with upper arms straight out from sides, elbows bent to 90. Pinch shoulder blades together and raise arms a few inches from floor. Repeat __10__ times per set. Do __2__ sets per session. Do __1__ sessions per week.  (engage abdominals to protect low back)  http://orth.exer.us/956  Child Pose    Sitting on knees, fold body over legs and relax head and arms on floor. Hold for _5___ breaths.  http://yg.exer.[email or web address]   [facility] [address] [place], [place] [address]  [phone] (office) [phone] (fax)'

## 2015-10-08 ENCOUNTER — Encounter: Payer: Self-pay | Admitting: Physical Therapy

## 2015-12-05 ENCOUNTER — Other Ambulatory Visit: Payer: Self-pay | Admitting: Sports Medicine

## 2016-02-01 ENCOUNTER — Encounter: Payer: Self-pay | Admitting: Family Medicine

## 2016-02-01 ENCOUNTER — Ambulatory Visit (INDEPENDENT_AMBULATORY_CARE_PROVIDER_SITE_OTHER): Payer: 59 | Admitting: Family Medicine

## 2016-02-01 VITALS — BP 95/64 | HR 86 | Temp 98.5°F | Wt 258.0 lb

## 2016-02-01 DIAGNOSIS — R112 Nausea with vomiting, unspecified: Secondary | ICD-10-CM | POA: Diagnosis not present

## 2016-02-01 DIAGNOSIS — R109 Unspecified abdominal pain: Secondary | ICD-10-CM | POA: Diagnosis not present

## 2016-02-01 MED ORDER — ONDANSETRON HCL 4 MG PO TABS: 4.0000 mg | ORAL_TABLET | Freq: Three times a day (TID) | ORAL | Status: DC | PRN

## 2016-02-01 NOTE — Progress Notes (Signed)
CC: Alan Gardner is a 45 y.o. male is here for Nausea and Emesis   Subjective: HPI:  On Friday of last week patient experienced a gradual onset of chest discomfort in the epigastric region. Symptoms are worsened by eating or consuming any liquid. He did not results from Prevacid nor Pepcid so he was seen at an emergency room in [place] and kept overnight for observation to rule out ACS. He said the symptoms would improve if he took a GI cocktail or IV Protonix. He was sent home with Protonix and the majority of the pain has gone away. His only complaint now is that ever since Sunday he's been experiencing nausea and even had one episode of vomiting this morning. He's lost his appetite and he does like he's getting hot and cold chills. He denies constipation or diarrhea. He denies blood in stool or vomit. He denies shortness of breath or wheezing. He denies any radiation of his pain, he localizes it as "generalized in the stomach" while pointing to the entire abdomen.   Review Of Systems Outlined In HPI  Past Medical History  Diagnosis Date  . Hypertension   . Hyperlipemia     Past Surgical History  Procedure Laterality Date  . Kidney stone surgery     Family History  Problem Relation Age of Onset  . Hypertension Mother     Social History   Social History  . Marital Status: Married    Spouse Name: N/A  . Number of Children: N/A  . Years of Education: N/A   Occupational History  . Not on file.   Social History Main Topics  . Smoking status: Never Smoker   . Smokeless tobacco: Not on file  . Alcohol Use: Yes  . Drug Use: No  . Sexual Activity: Not on file   Other Topics Concern  . Not on file   Social History Narrative     Objective: BP 95/64 mmHg  Pulse 86  Temp(Src) 98.5 F (36.9 C) (Oral)  Wt 258 lb (117.028 kg)  SpO2 96%  General: Alert and Oriented, No Acute Distress HEENT: Pupils equal, round, reactive to light. Conjunctivae clear.  Moist mucous  membranes Lungs: Clear to auscultation bilaterally, no wheezing/ronchi/rales.  Comfortable work of breathing. Good air movement. Cardiac: Regular rate and rhythm. Normal S1/S2.  No murmurs, rubs, nor gallops.   Abdomen: Normal bowel sounds, soft and non tender without palpable masses. No rebound tenderness or guarding. Extremities: No peripheral edema.  Strong peripheral pulses.  Mental Status: No depression, anxiety, nor agitation. Skin: Warm and dry.  Assessment & Plan: Alan Gardner was seen today for nausea and emesis.  Diagnoses and all orders for this visit:  Nausea and vomiting, intractability of vomiting not specified, unspecified vomiting type -     ondansetron (ZOFRAN) 4 MG tablet; Take 1-2 tablets (4-8 mg total) by mouth every 8 (eight) hours as needed for nausea or vomiting. -     COMPLETE METABOLIC PANEL WITH GFR -     CBC with Differential/Platelet -     Lipase  Abdominal pain, unspecified abdominal location -     COMPLETE METABOLIC PANEL WITH GFR -     CBC with Differential/Platelet -     Lipase   Nausea and vomiting with abdominal pain: Start Zofran, obtaining labs above to rule out hepatitis, pancreatitis and if symptoms are still present with normal labs tomorrow the next step would be a ultrasound of the abdomen, CT scan of the abdomen if symptoms  are worsening.  No Follow-up on file.

## 2016-02-02 ENCOUNTER — Ambulatory Visit (INDEPENDENT_AMBULATORY_CARE_PROVIDER_SITE_OTHER): Payer: 59

## 2016-02-02 ENCOUNTER — Telehealth: Payer: Self-pay | Admitting: Family Medicine

## 2016-02-02 DIAGNOSIS — K760 Fatty (change of) liver, not elsewhere classified: Secondary | ICD-10-CM | POA: Diagnosis not present

## 2016-02-02 DIAGNOSIS — R112 Nausea with vomiting, unspecified: Secondary | ICD-10-CM

## 2016-02-02 DIAGNOSIS — K8020 Calculus of gallbladder without cholecystitis without obstruction: Secondary | ICD-10-CM

## 2016-02-02 DIAGNOSIS — R740 Nonspecific elevation of levels of transaminase and lactic acid dehydrogenase [LDH]: Secondary | ICD-10-CM

## 2016-02-02 DIAGNOSIS — R7401 Elevation of levels of liver transaminase levels: Secondary | ICD-10-CM

## 2016-02-02 LAB — COMPLETE METABOLIC PANEL WITH GFR
ALBUMIN: 3.6 g/dL (ref 3.6–5.1)
ALK PHOS: 88 U/L (ref 40–115)
ALT: 48 U/L — AB (ref 9–46)
AST: 19 U/L (ref 10–40)
BILIRUBIN TOTAL: 0.8 mg/dL (ref 0.2–1.2)
BUN: 22 mg/dL (ref 7–25)
CO2: 25 mmol/L (ref 20–31)
CREATININE: 1 mg/dL (ref 0.60–1.35)
Calcium: 8.5 mg/dL — ABNORMAL LOW (ref 8.6–10.3)
Chloride: 101 mmol/L (ref 98–110)
GLUCOSE: 99 mg/dL (ref 65–99)
Potassium: 4.3 mmol/L (ref 3.5–5.3)
SODIUM: 138 mmol/L (ref 135–146)
TOTAL PROTEIN: 6.7 g/dL (ref 6.1–8.1)

## 2016-02-02 LAB — CBC WITH DIFFERENTIAL/PLATELET
BASOS PCT: 0 % (ref 0–1)
Basophils Absolute: 0 10*3/uL (ref 0.0–0.1)
EOS ABS: 0.1 10*3/uL (ref 0.0–0.7)
EOS PCT: 1 % (ref 0–5)
HCT: 41.4 % (ref 39.0–52.0)
Hemoglobin: 14.5 g/dL (ref 13.0–17.0)
LYMPHS ABS: 0.8 10*3/uL (ref 0.7–4.0)
Lymphocytes Relative: 9 % — ABNORMAL LOW (ref 12–46)
MCH: 29.2 pg (ref 26.0–34.0)
MCHC: 35 g/dL (ref 30.0–36.0)
MCV: 83.5 fL (ref 78.0–100.0)
MONOS PCT: 9 % (ref 3–12)
MPV: 7.8 fL — ABNORMAL LOW (ref 8.6–12.4)
Monocytes Absolute: 0.8 10*3/uL (ref 0.1–1.0)
NEUTROS PCT: 81 % — AB (ref 43–77)
Neutro Abs: 7 10*3/uL (ref 1.7–7.7)
Platelets: 231 10*3/uL (ref 150–400)
RBC: 4.96 MIL/uL (ref 4.22–5.81)
RDW: 13.9 % (ref 11.5–15.5)
WBC: 8.6 10*3/uL (ref 4.0–10.5)

## 2016-02-02 LAB — LIPASE: Lipase: 14 U/L (ref 7–60)

## 2016-02-02 NOTE — Telephone Encounter (Signed)
Will you please let patient know that one of his liver enzymes was mildly elevated and as discussed during yesterday's visit I'd recommend getting an ultrasound of the abdomen.  I've placed an order for this, will you please give him the number to the radiology office downstairs to schedule this today.

## 2016-02-02 NOTE — Telephone Encounter (Signed)
PT ADVISED.

## 2016-02-05 ENCOUNTER — Encounter: Payer: Self-pay | Admitting: Sports Medicine

## 2016-02-05 ENCOUNTER — Telehealth: Payer: Self-pay | Admitting: Sports Medicine

## 2016-02-05 ENCOUNTER — Ambulatory Visit (INDEPENDENT_AMBULATORY_CARE_PROVIDER_SITE_OTHER): Payer: 59 | Admitting: Sports Medicine

## 2016-02-05 VITALS — BP 120/82 | HR 66 | Resp 18 | Wt 252.9 lb

## 2016-02-05 DIAGNOSIS — Z9049 Acquired absence of other specified parts of digestive tract: Secondary | ICD-10-CM | POA: Insufficient documentation

## 2016-02-05 DIAGNOSIS — R1013 Epigastric pain: Secondary | ICD-10-CM

## 2016-02-05 MED ORDER — SUCRALFATE 1 G PO TABS: 1.0000 g | ORAL_TABLET | Freq: Two times a day (BID) | ORAL | Status: DC

## 2016-02-05 NOTE — Telephone Encounter (Signed)
Peer to Peer call completed for HIDA scan with CCK injection, approved with approval number [id number], expires in 45 days on 03/21/2016.

## 2016-02-05 NOTE — Progress Notes (Signed)
  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 He was seen in the emergency department where IV acid blockers were effective for controlling his pain. He also had an abdominal ultrasound that showed cholelithiasis without choledocholithiasis or cholecystitis. Symptoms are moderate, persistent, blood work was negative with the exception of a mildly elevated anything he, lipase was normal. No constitutional symptoms, on further questioning he's had these symptoms for years. Minimal nausea and vomiting, no melena, hematochezia, hematemesis. No constitutional symptoms, no rash. He was admitted to the hospital for a ACS rule out, ultimately was negative. Has not had a stress test. Pain is nonexertional.  Past medical history, Surgical history, Family history not pertinant except as noted below, Social history, Allergies, and medications have been entered into the medical record, reviewed, and no changes needed.   Review of Systems: No fevers, chills, night sweats, weight loss, chest pain, or shortness of breath.   Objective:    General: Well Developed, well nourished, and in no acute distress.  Neuro: Alert and oriented x3, extra-ocular muscles intact, sensation grossly intact.  HEENT: Normocephalic, atraumatic, pupils equal round reactive to light, neck supple, no masses, no lymphadenopathy, thyroid nonpalpable.  Skin: Warm and dry, no rashes. Cardiac: Regular rate and rhythm, no murmurs rubs or gallops, no lower extremity edema.  Respiratory: Clear to auscultation bilaterally. Not using accessory muscles, speaking in full sentences. Abdomen: Soft, nontender, not distended, normal bowel sounds, palpable masses, no guarding, rigidity, rebound tenderness.  Impression and Recommendations:    I spent 25 minutes with this patient, greater than 50% was face-to-face time  counseling regarding the above diagnoses

## 2016-02-05 NOTE — Assessment & Plan Note (Signed)
Epigastric, some symptoms are suggestive of peptic ulcer disease, others of biliary colic. Referral to gastroenterology for consideration of EGD. Also going to add a HIDA scan, protonic twice a day and Carafate. Return to see me in 2-3 weeks, and for planning of further management. CBC, CMP were unremarkable, normal lipase. He does have cholelithiasis an ultrasound

## 2016-02-08 NOTE — Addendum Note (Signed)
Addended by: Collie SiadICHARDSON, Zareah Hunzeker M on: 02/08/2016 09:15 AM   Modules accepted: Orders

## 2016-02-08 NOTE — Telephone Encounter (Signed)
Pt scheduled and notified

## 2016-02-09 ENCOUNTER — Ambulatory Visit (HOSPITAL_COMMUNITY)
Admission: RE | Admit: 2016-02-09 | Discharge: 2016-02-09 | Disposition: A | Discharge status: Home / Self Care | Payer: 59 | Source: Ambulatory Visit | Attending: Sports Medicine | Admitting: Sports Medicine

## 2016-02-09 ENCOUNTER — Other Ambulatory Visit: Payer: Self-pay | Admitting: Sports Medicine

## 2016-02-09 DIAGNOSIS — R932 Abnormal findings on diagnostic imaging of liver and biliary tract: Secondary | ICD-10-CM | POA: Diagnosis not present

## 2016-02-09 DIAGNOSIS — R1013 Epigastric pain: Secondary | ICD-10-CM | POA: Diagnosis not present

## 2016-02-09 DIAGNOSIS — K8050 Calculus of bile duct without cholangitis or cholecystitis without obstruction: Secondary | ICD-10-CM

## 2016-02-09 MED ORDER — TECHNETIUM TC 99M MEBROFENIN IV KIT
5.0000 | PACK | Freq: Once | INTRAVENOUS | Status: AC | PRN
  Administered 2016-02-09: 5 via INTRAVENOUS

## 2016-02-18 ENCOUNTER — Ambulatory Visit: Payer: Self-pay | Admitting: General Surgery

## 2016-02-18 NOTE — H&P (Signed)
History of Present Illness Alan Gardner(Ardith Test MD; 02/18/2016 8:59 AM) The patient is a 45 year old male who presents for evaluation of gall stones. The patient is a 45 year old male who is referred by Dr. Rodney Langtonhomas Thekkekandam for evaluation of biliary dyskinesia and symptomatic stones. Patient states she's had epigastric abdominal pain over the last 2-3 weeks. He states this is associated with nausea and vomiting. This made it better. He states this is usually after a high fatty diet.  Patient works as a Equities traderpost office mechanic.   Other Problems Fay Records(Ashley Beck, CMA; 02/18/2016 8:45 AM) Anxiety Disorder Arthritis Cholelithiasis Depression Gastroesophageal Reflux Disease Hemorrhoids High blood pressure Hypercholesterolemia Kidney Stone Thyroid Disease  Diagnostic Studies History Fay Records(Ashley Beck, CMA; 02/18/2016 8:45 AM) Colonoscopy never  Allergies Fay Records(Ashley Beck, CMA; 02/18/2016 8:46 AM) No Known Drug Allergies03/23/2017  Medication History Fay Records(Ashley Beck, CMA; 02/18/2016 8:46 AM) Pantoprazole Sodium (40MG  Tablet DR, Oral) Active. Medications Reconciled  Social History Fay Records(Ashley [place]; 02/18/2016 8:45 AM) Alcohol use Occasional alcohol use. Caffeine use Carbonated beverages, Coffee, Tea. No drug use Tobacco use Never smoker.  Family History Fay Records(Ashley Beck, [place]; 02/18/2016 8:45 AM) Diabetes Mellitus Family Members In General, Father. Heart Disease Family Members In General. Hypertension Mother. Ischemic Bowel Disease Mother.    Review of Systems Fay Records(Ashley Beck CMA; 02/18/2016 8:45 AM) General Not Present- Appetite Loss, Chills, Fatigue, Fever, Night Sweats, Weight Gain and Weight Loss. Skin Not Present- Change in Wart/Mole, Dryness, Hives, Jaundice, New Lesions, Non-Healing Wounds, Rash and Ulcer. HEENT Not Present- Earache, Hearing Loss, Hoarseness, Nose Bleed, Oral Ulcers, Ringing in the Ears, Seasonal Allergies, Sinus Pain, Sore Throat, Visual Disturbances, Wears  glasses/contact lenses and Yellow Eyes. Respiratory Present- Snoring. Not Present- Bloody sputum, Chronic Cough, Difficulty Breathing and Wheezing. Breast Not Present- Breast Mass, Breast Pain, Nipple Discharge and Skin Changes. Cardiovascular Not Present- Chest Pain, Difficulty Breathing Lying Down, Leg Cramps, Palpitations, Rapid Heart Rate, Shortness of Breath and Swelling of Extremities. Gastrointestinal Present- Hemorrhoids. Not Present- Abdominal Pain, Bloating, Bloody Stool, Change in Bowel Habits, Chronic diarrhea, Constipation, Difficulty Swallowing, Excessive gas, Gets full quickly at meals, Indigestion, Nausea, Rectal Pain and Vomiting. Male Genitourinary Not Present- Blood in Urine, Change in Urinary Stream, Frequency, Impotence, Nocturia, Painful Urination, Urgency and Urine Leakage. Musculoskeletal Present- Joint Pain and Joint Stiffness. Not Present- Back Pain, Muscle Pain, Muscle Weakness and Swelling of Extremities. Neurological Not Present- Decreased Memory, Fainting, Headaches, Numbness, Seizures, Tingling, Tremor, Trouble walking and Weakness. Psychiatric Not Present- Anxiety, Bipolar, Change in Sleep Pattern, Depression, Fearful and Frequent crying. Endocrine Not Present- Cold Intolerance, Excessive Hunger, Hair Changes, Heat Intolerance, Hot flashes and New Diabetes. Hematology Not Present- Easy Bruising, Excessive bleeding, Gland problems, HIV and Persistent Infections.  Vitals Fay Records(Ashley Beck CMA; 02/18/2016 8:46 AM) 02/18/2016 8:46 AM Weight: 256.4 lb Height: 73in Body Surface Area: 2.39 m Body Mass Index: 33.83 kg/m  Temp.: 98.62F  Pulse: 86 (Regular)  BP: 128/80 (Sitting, Left Arm, Standard)       Physical Exam Alan Gardner(Toure Edmonds, MD; 02/18/2016 9:0 AM) General Mental Status-Alert. General Appearance-Consistent with stated age. Hydration-Well hydrated. Voice-Normal.  Head and Neck Head-normocephalic, atraumatic with no lesions or palpable  masses.  Eye Eyeball - Bilateral-Extraocular movements intact. Sclera/Conjunctiva - Bilateral-No scleral icterus.  Chest and Lung Exam Chest and lung exam reveals -quiet, even and easy respiratory effort with no use of accessory muscles. Inspection Chest Wall - Normal. Back - normal.  Cardiovascular Cardiovascular examination reveals -normal heart sounds, regular rate and rhythm with no murmurs.  Abdomen Inspection  Normal Exam - No Hernias. Palpation/Percussion Normal exam - Soft, Non Tender, No Rebound tenderness, No Rigidity (guarding) and No hepatosplenomegaly. Auscultation Normal exam - Bowel sounds normal.  Neurologic Neurologic evaluation reveals -alert and oriented x 3 with no impairment of recent or remote memory. Mental Status-Normal.  Musculoskeletal Normal Exam - Left-Upper Extremity Strength Normal and Lower Extremity Strength Normal. Normal Exam - Right-Upper Extremity Strength Normal, Lower Extremity Weakness.    Assessment & Plan Alan Filler MD; 02/18/2016 8:59 AM) SYMPTOMATIC CHOLELITHIASIS (K80.20) Impression: 45 year old male with likely symptomatic cholelithiasis  1. We will proceed to the operating room for a laparoscopic cholecystectomy 2. Risks and benefits were discussed with the patient to generally include, but not limited to: infection, bleeding, possible need for post op ERCP, damage to the bile ducts, bile leak, and possible need for further surgery. Alternatives were offered and described. All questions were answered and the patient voiced understanding of the procedure and wishes to proceed at this point with a laparoscopic cholecystectomy

## 2016-02-24 ENCOUNTER — Other Ambulatory Visit: Payer: Self-pay | Admitting: Sports Medicine

## 2016-03-29 ENCOUNTER — Other Ambulatory Visit: Payer: Self-pay | Admitting: General Surgery

## 2016-09-07 ENCOUNTER — Other Ambulatory Visit: Payer: Self-pay | Admitting: Sports Medicine

## 2016-09-07 DIAGNOSIS — I10 Essential (primary) hypertension: Secondary | ICD-10-CM

## 2016-11-24 ENCOUNTER — Other Ambulatory Visit: Payer: Self-pay | Admitting: Sports Medicine

## 2016-11-24 DIAGNOSIS — E039 Hypothyroidism, unspecified: Secondary | ICD-10-CM

## 2016-11-24 DIAGNOSIS — E038 Other specified hypothyroidism: Secondary | ICD-10-CM

## 2017-01-10 ENCOUNTER — Other Ambulatory Visit: Payer: Self-pay | Admitting: Sports Medicine

## 2017-01-10 DIAGNOSIS — E039 Hypothyroidism, unspecified: Secondary | ICD-10-CM

## 2017-01-10 DIAGNOSIS — E038 Other specified hypothyroidism: Secondary | ICD-10-CM

## 2017-03-25 IMAGING — US US SOFT TISSUE HEAD/NECK
1 series · 13 of 25 positions shown · non-contrast
Comparison: None.

CLINICAL DATA: Palpable right-sided thyroid nodule.

EXAM:
THYROID ULTRASOUND
TECHNIQUE: Ultrasound examination of the thyroid gland and adjacent soft
tissues was performed.

[Series 1: us soft tissue head/neck · 0.07mm/px · 13 of 42 slices shown]
[im 1/42]
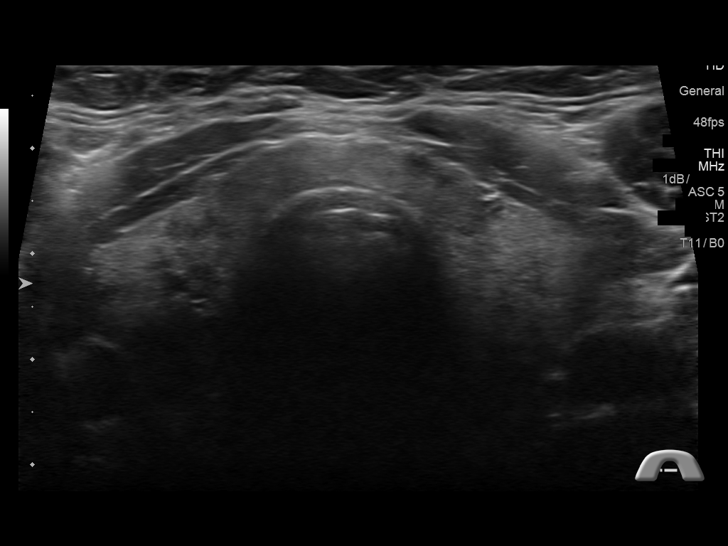
[im 4/42]
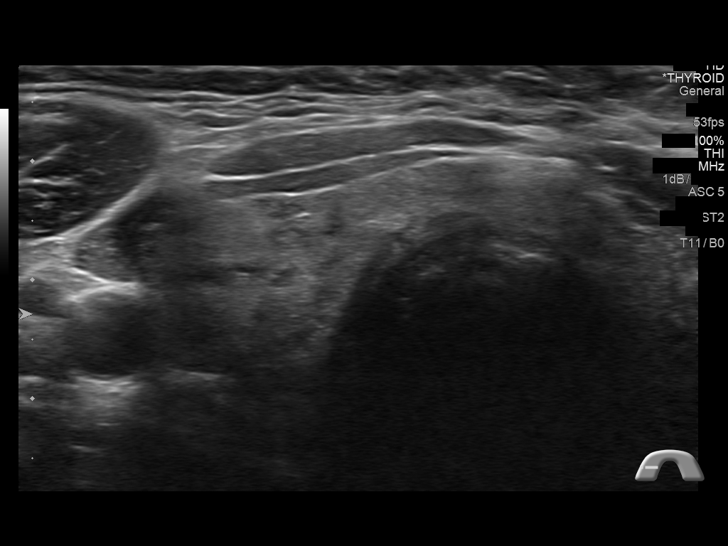
[im 7/42]
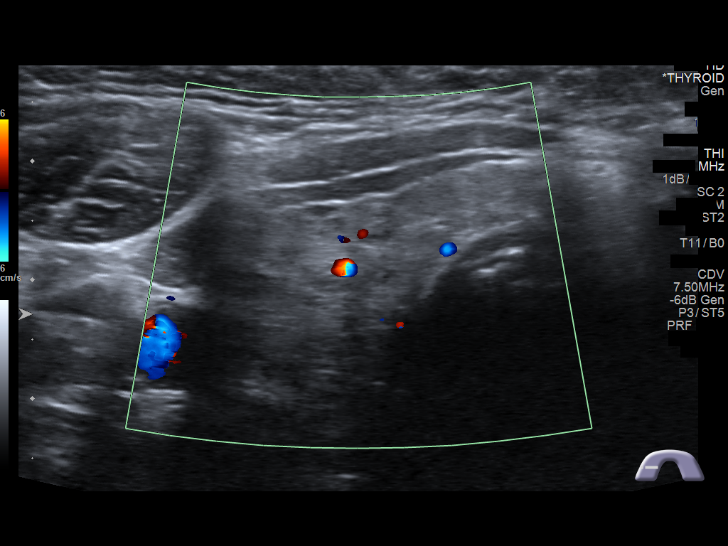
[im 11/42]
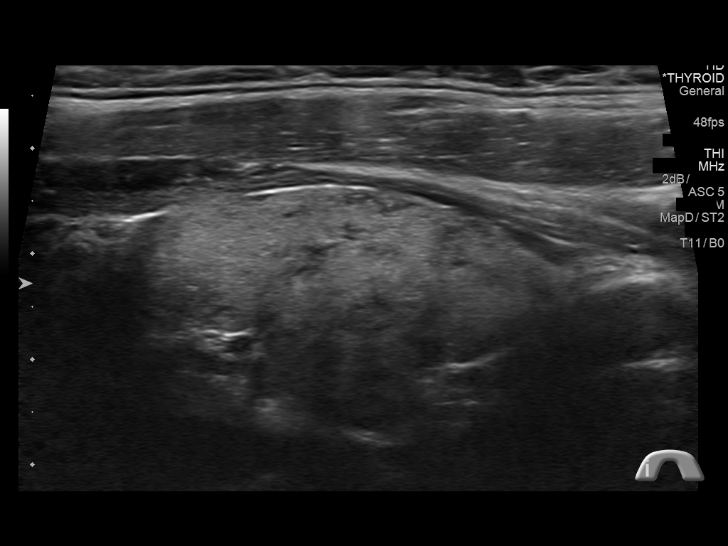
[im 14/42]
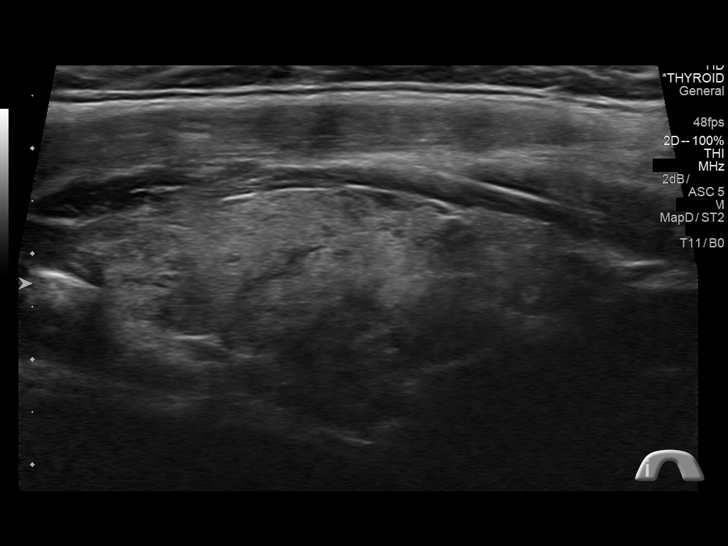
[im 18/42]
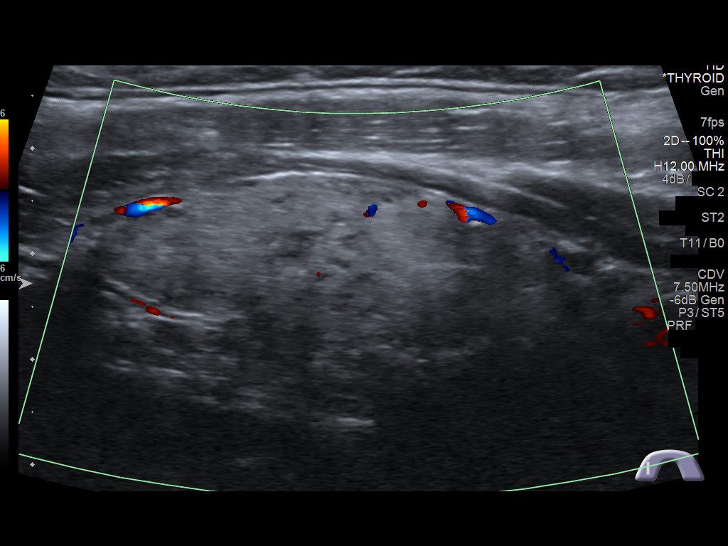
[im 21/42]
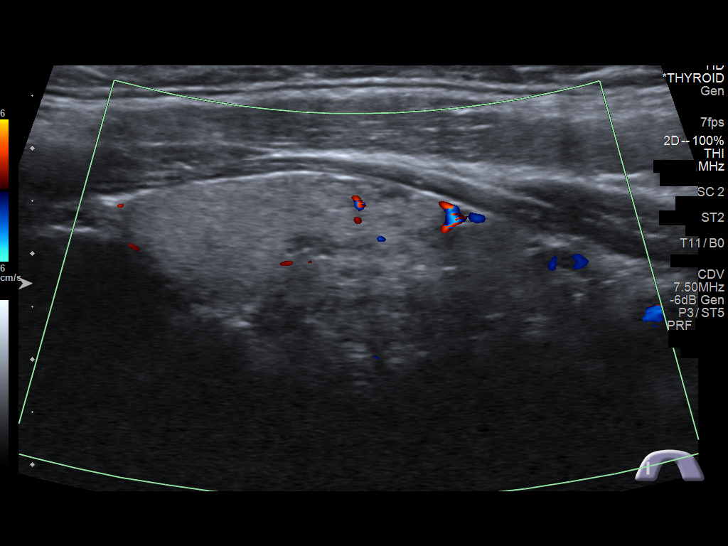
[im 24/42]
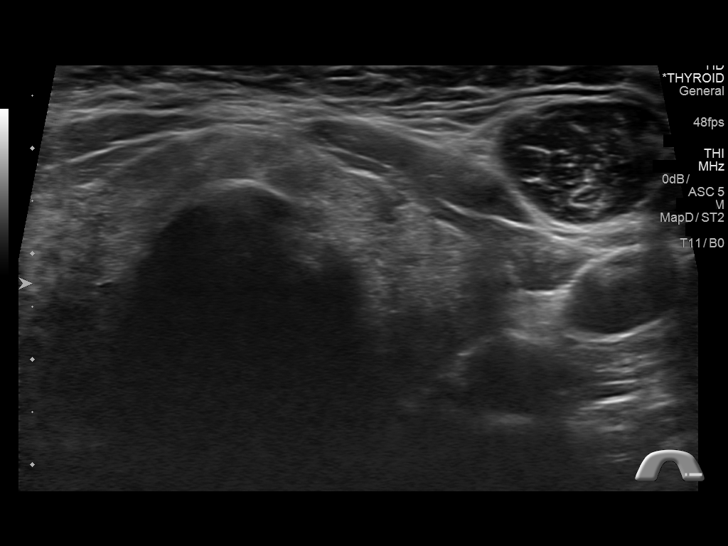
[im 28/42]
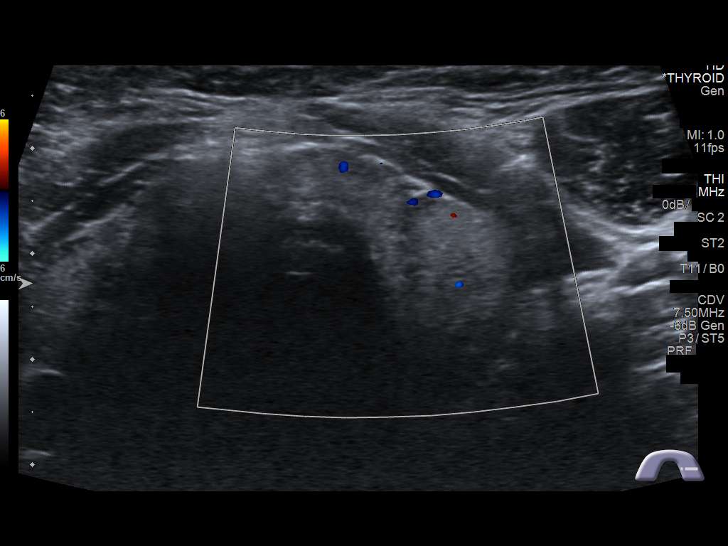
[im 31/42]
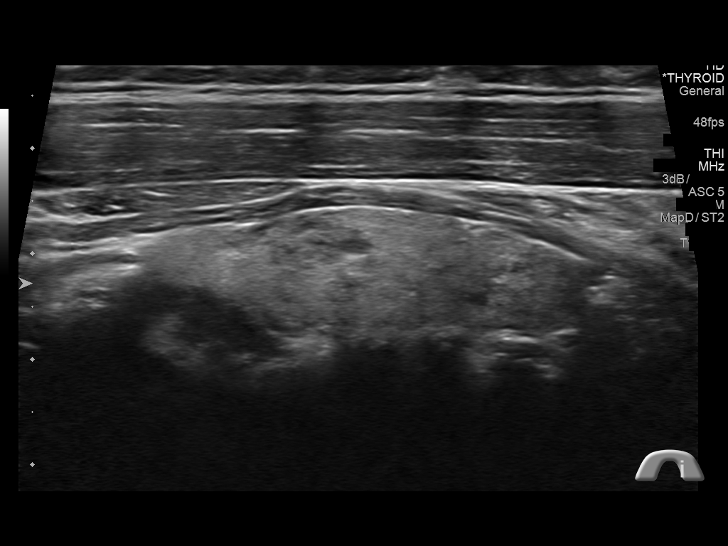
[im 35/42]
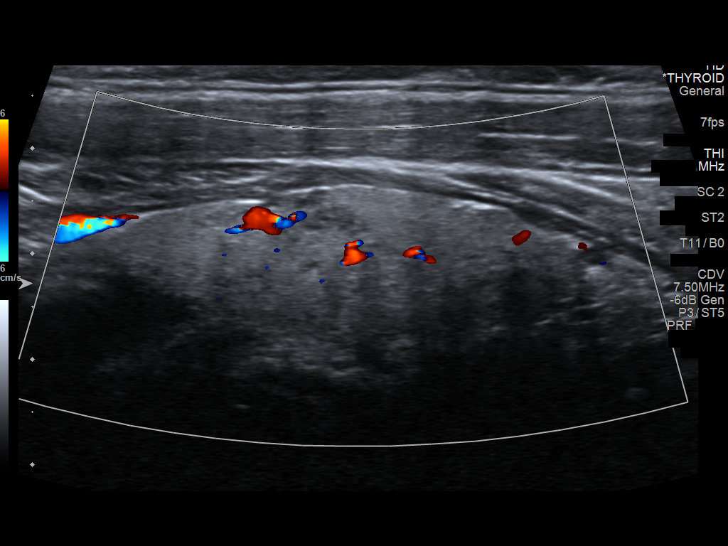
[im 38/42]
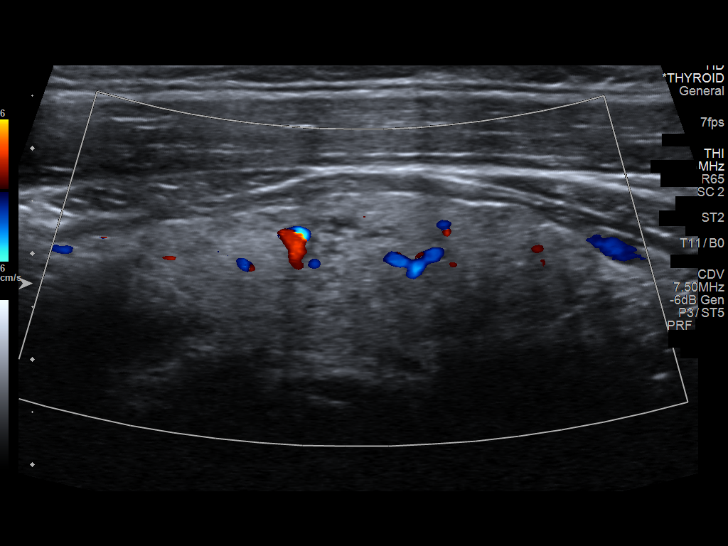
[im 42/42]
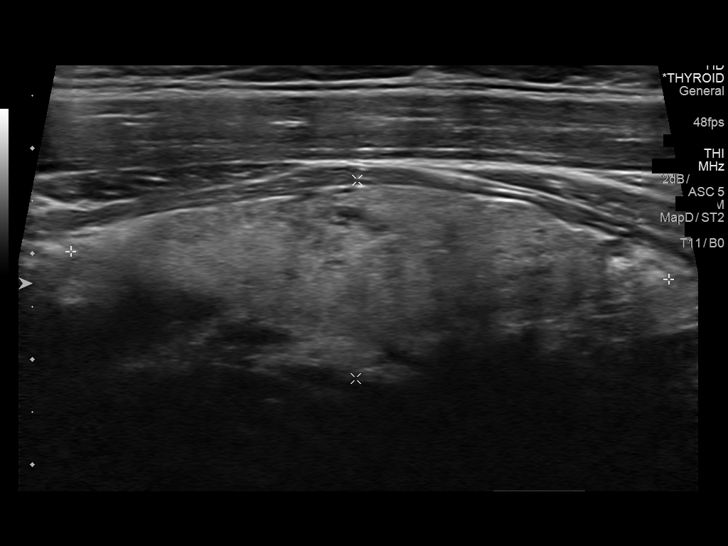

[13 of 25 positions shown; findings below may reference images not displayed]

FINDINGS: There is diffuse heterogeneity of thyroid parenchymal echotexture.

Right thyroid lobe

Measurements: Borderline enlarged measuring 5.9 x 2.5 x 2.6 cm.

Right, mid, posterior - 1.8 x 1.9 x 1.5 cm - mixed echogenic, solid,
ill-defined, potentially a pseudo nodule.

Left thyroid lobe

Measurements: Normal in size measuring 5.7 x 1.9 x 1.5 cm.

There is diffuse heterogeneity of the left lobe of the thyroid
without discrete nodule or mass.

Isthmus

Thickness: Normal in size measuring 0.5 cm in diameter.

No discrete nodule or mass identified within the thyroid isthmus.

Lymphadenopathy

None visualized.
IMPRESSION: Indeterminate solitary ill-defined approximately 1.9 cm
nodule/pseudo nodule within the right lobe of the thyroid likely
correlates with the patient palpable area of concern. This nodule
meets imaging criteria to recommend percutaneous sampling as
clinically indicated. This recommendation follows the consensus
statement: Management of Thyroid Nodules Detected at US: Society of
Radiologists in Ultrasound Consensus Conference Statement. Radiology

## 2017-06-28 ENCOUNTER — Other Ambulatory Visit: Payer: Self-pay | Admitting: Sports Medicine

## 2017-06-28 DIAGNOSIS — I10 Essential (primary) hypertension: Secondary | ICD-10-CM

## 2017-06-29 ENCOUNTER — Other Ambulatory Visit: Payer: Self-pay | Admitting: Sports Medicine

## 2017-06-29 DIAGNOSIS — I10 Essential (primary) hypertension: Secondary | ICD-10-CM

## 2017-08-04 ENCOUNTER — Encounter: Payer: Self-pay | Admitting: Sports Medicine

## 2017-08-04 ENCOUNTER — Ambulatory Visit (INDEPENDENT_AMBULATORY_CARE_PROVIDER_SITE_OTHER): Payer: 59 | Admitting: Sports Medicine

## 2017-08-04 VITALS — BP 119/72 | HR 71 | Resp 16 | Wt 275.0 lb

## 2017-08-04 DIAGNOSIS — E7800 Pure hypercholesterolemia, unspecified: Secondary | ICD-10-CM | POA: Diagnosis not present

## 2017-08-04 DIAGNOSIS — L719 Rosacea, unspecified: Secondary | ICD-10-CM | POA: Diagnosis not present

## 2017-08-04 DIAGNOSIS — E6609 Other obesity due to excess calories: Secondary | ICD-10-CM | POA: Diagnosis not present

## 2017-08-04 DIAGNOSIS — Z23 Encounter for immunization: Secondary | ICD-10-CM | POA: Diagnosis not present

## 2017-08-04 DIAGNOSIS — R7303 Prediabetes: Secondary | ICD-10-CM | POA: Diagnosis not present

## 2017-08-04 DIAGNOSIS — Z9049 Acquired absence of other specified parts of digestive tract: Secondary | ICD-10-CM

## 2017-08-04 DIAGNOSIS — I10 Essential (primary) hypertension: Secondary | ICD-10-CM | POA: Diagnosis not present

## 2017-08-04 DIAGNOSIS — E039 Hypothyroidism, unspecified: Secondary | ICD-10-CM

## 2017-08-04 DIAGNOSIS — Z0000 Encounter for general adult medical examination without abnormal findings: Secondary | ICD-10-CM

## 2017-08-04 DIAGNOSIS — E038 Other specified hypothyroidism: Secondary | ICD-10-CM

## 2017-08-04 MED ORDER — LISINOPRIL-HYDROCHLOROTHIAZIDE 20-12.5 MG PO TABS: 1.0000 | ORAL_TABLET | Freq: Every day | ORAL | 0 refills | Status: DC

## 2017-08-04 MED ORDER — METRONIDAZOLE 0.75 % EX GEL: 1.0000 "application " | Freq: Two times a day (BID) | CUTANEOUS | 3 refills | Status: DC

## 2017-08-04 MED ORDER — PHENTERMINE HCL 37.5 MG PO TABS: ORAL_TABLET | ORAL | 0 refills | Status: DC

## 2017-08-04 MED ORDER — LEVOTHYROXINE SODIUM 50 MCG PO TABS: 50.0000 ug | ORAL_TABLET | Freq: Every day | ORAL | 0 refills | Status: DC

## 2017-08-04 NOTE — Progress Notes (Signed)
  Subjective:    CC: Follow-up   HPI: I have not seen Alan Gardner in a long time, 1.5 years.  Hypertension: Controlled.  Subclinical hypothyroidism: Controlled  Skin rash: Nose, occasional breakouts, red, no pain.  Preventive measures: Due for pneumonia vaccine, HIV screening, foot exam. His eye exam was over a year ago, he agrees to get caught up with this.  Obesity: Would like to get back on weight loss treatment.  Past medical history:  Negative.  See flowsheet/record as well for more information.  Surgical history: Negative.  See flowsheet/record as well for more information.  Family history: Negative.  See flowsheet/record as well for more information.  Social history: Negative.  See flowsheet/record as well for more information.  Allergies, and medications have been entered into the medical record, reviewed, and no changes needed.   Review of Systems: No fevers, chills, night sweats, weight loss, chest pain, or shortness of breath.   Objective:    General: Well Developed, well nourished, and in no acute distress.  Neuro: Alert and oriented x3, extra-ocular muscles intact, sensation grossly intact.  HEENT: Normocephalic, atraumatic, pupils equal round reactive to light, neck supple, no masses, no lymphadenopathy, thyroid nonpalpable.  Skin: Warm and dry, no rashes. Cardiac: Regular rate and rhythm, no murmurs rubs or gallops, no lower extremity edema.  Respiratory: Clear to auscultation bilaterally. Not using accessory muscles, speaking in full sentences.  Impression and Recommendations:    Annual physical exam Checking routine labs. Pneumonia vaccine today.  Essential hypertension, benign Controlled, refilling medication  Hyperlipidemia Rechecking lipids  Obesity Restarting phentermine, return monthly for weight checks and refills, declines nutrition referral, exercise prescription given.  Prediabetes Rechecking hemoglobin A1c  Subclinical hypothyroidism Rechecking  TSH  Rosacea Adding topical metronidazole  History of cholecystectomy Abdominal symptoms resolved after cholecystectomy.   ___________________________________________ Alan Gardner, M.D., ABFM., CAQSM. Primary Care and Sports Medicine [place]  Adjunct Instructor of Family Medicine  [employer]

## 2017-08-04 NOTE — Assessment & Plan Note (Signed)
Abdominal symptoms resolved after cholecystectomy.

## 2017-08-04 NOTE — Assessment & Plan Note (Signed)
Rechecking hemoglobin A1c 

## 2017-08-04 NOTE — Assessment & Plan Note (Signed)
Rechecking TSH 

## 2017-08-04 NOTE — Assessment & Plan Note (Signed)
Restarting phentermine, return monthly for weight checks and refills, declines nutrition referral, exercise prescription given.

## 2017-08-04 NOTE — Assessment & Plan Note (Signed)
Rechecking lipids. 

## 2017-08-04 NOTE — Assessment & Plan Note (Signed)
Controlled, refilling medication

## 2017-08-04 NOTE — Assessment & Plan Note (Signed)
Adding topical metronidazole

## 2017-08-04 NOTE — Assessment & Plan Note (Signed)
Checking routine labs. Pneumonia vaccine today.

## 2017-08-05 ENCOUNTER — Other Ambulatory Visit: Payer: Self-pay | Admitting: Sports Medicine

## 2017-08-05 LAB — COMPREHENSIVE METABOLIC PANEL WITH GFR
ALT: 20 U/L (ref 9–46)
AST: 13 U/L (ref 10–40)
Alkaline phosphatase (APISO): 78 U/L (ref 40–115)
BUN: 22 mg/dL (ref 7–25)
Creat: 0.82 mg/dL (ref 0.60–1.35)
Glucose, Bld: 97 mg/dL (ref 65–99)

## 2017-08-05 LAB — COMPREHENSIVE METABOLIC PANEL
AG Ratio: 1.3 (calc) (ref 1.0–2.5)
Albumin: 4 g/dL (ref 3.6–5.1)
CO2: 27 mmol/L (ref 20–32)
Calcium: 9.3 mg/dL (ref 8.6–10.3)
Chloride: 105 mmol/L (ref 98–110)
Globulin: 3.2 g/dL (calc) (ref 1.9–3.7)
Potassium: 4.3 mmol/L (ref 3.5–5.3)
Sodium: 140 mmol/L (ref 135–146)
Total Bilirubin: 0.8 mg/dL (ref 0.2–1.2)
Total Protein: 7.2 g/dL (ref 6.1–8.1)

## 2017-08-05 LAB — CBC
HCT: 42.7 % (ref 38.5–50.0)
Hemoglobin: 14.5 g/dL (ref 13.2–17.1)
MCH: 28.4 pg (ref 27.0–33.0)
MCHC: 34 g/dL (ref 32.0–36.0)
MCV: 83.6 fL (ref 80.0–100.0)
MPV: 8.4 fL (ref 7.5–12.5)
Platelets: 276 Thousand/uL (ref 140–400)
RBC: 5.11 10*6/uL (ref 4.20–5.80)
RDW: 13.5 % (ref 11.0–15.0)
WBC: 7.9 10*3/uL (ref 3.8–10.8)

## 2017-08-05 LAB — LIPID PANEL W/REFLEX DIRECT LDL
Cholesterol: 200 mg/dL — ABNORMAL HIGH (ref <200)
HDL: 37 mg/dL — ABNORMAL LOW (ref >40)
LDL Cholesterol (Calc): 136 mg/dL — ABNORMAL HIGH
Non-HDL Cholesterol (Calc): 163 mg/dL (calc) — ABNORMAL HIGH (ref <130)
Total CHOL/HDL Ratio: 5.4 (calc) — ABNORMAL HIGH (ref <5.0)
Triglycerides: 142 mg/dL (ref <150)

## 2017-08-05 LAB — VITAMIN D 25 HYDROXY (VIT D DEFICIENCY, FRACTURES): Vit D, 25-Hydroxy: 22 ng/mL — ABNORMAL LOW (ref 30–100)

## 2017-08-05 LAB — TSH: TSH: 4.08 mIU/L (ref 0.40–4.50)

## 2017-08-05 LAB — HEMOGLOBIN A1C
Hgb A1c MFr Bld: 5.3 % of total Hgb (ref <5.7)
Mean Plasma Glucose: 105 (calc)
eAG (mmol/L): 5.8 (calc)

## 2017-08-05 LAB — HIV ANTIBODY (ROUTINE TESTING W REFLEX): HIV 1&2 Ab, 4th Generation: NONREACTIVE

## 2017-08-05 MED ORDER — VITAMIN D (ERGOCALCIFEROL) 1.25 MG (50000 UNIT) PO CAPS: 50000.0000 [IU] | ORAL_CAPSULE | ORAL | 0 refills | Status: DC

## 2017-08-05 NOTE — Addendum Note (Signed)
Addended by: Monica BectonHEKKEKANDAM, Javel Hersh J on: 08/05/2017 02:04 PM   Modules accepted: Orders

## 2017-09-01 ENCOUNTER — Ambulatory Visit (INDEPENDENT_AMBULATORY_CARE_PROVIDER_SITE_OTHER): Payer: 59 | Admitting: Sports Medicine

## 2017-09-01 ENCOUNTER — Other Ambulatory Visit: Payer: Self-pay | Admitting: Sports Medicine

## 2017-09-01 ENCOUNTER — Encounter: Payer: Self-pay | Admitting: Sports Medicine

## 2017-09-01 DIAGNOSIS — E6609 Other obesity due to excess calories: Secondary | ICD-10-CM | POA: Diagnosis not present

## 2017-09-01 MED ORDER — PHENTERMINE HCL 37.5 MG PO TABS: ORAL_TABLET | ORAL | 0 refills | Status: DC

## 2017-09-01 NOTE — Progress Notes (Signed)
  Subjective:    CC: Follow-up  HPI:  Obesity: 7 pound weight loss after the first month on phentermine, no questions.  Past medical history:  Negative.  See flowsheet/record as well for more information.  Surgical history: Negative.  See flowsheet/record as well for more information.  Family history: Negative.  See flowsheet/record as well for more information.  Social history: Negative.  See flowsheet/record as well for more information.  Allergies, and medications have been entered into the medical record, reviewed, and no changes needed.   Review of Systems: No fevers, chills, night sweats, weight loss, chest pain, or shortness of breath.   Objective:    General: Well Developed, well nourished, and in no acute distress.  Neuro: Alert and oriented x3, extra-ocular muscles intact, sensation grossly intact.  HEENT: Normocephalic, atraumatic, pupils equal round reactive to light, neck supple, no masses, no lymphadenopathy, thyroid nonpalpable.  Skin: Warm and dry, no rashes. Cardiac: Regular rate and rhythm, no murmurs rubs or gallops, no lower extremity edema.  Respiratory: Clear to auscultation bilaterally. Not using accessory muscles, speaking in full sentences.  Impression and Recommendations:    Obesity 6-7 pound weight loss after the first month on phentermine, refilling medication, return in one month.  ___________________________________________ Ihor Austin. Benjamin Stain, M.D., ABFM., CAQSM. Primary Care and Sports Medicine [place] [facility]  Adjunct Instructor of Family Medicine  [employer]

## 2017-09-01 NOTE — Assessment & Plan Note (Signed)
6-7 pound weight loss after the first month on phentermine, refilling medication, return in one month.

## 2017-09-20 ENCOUNTER — Other Ambulatory Visit: Payer: Self-pay | Admitting: Sports Medicine

## 2017-09-20 DIAGNOSIS — I10 Essential (primary) hypertension: Secondary | ICD-10-CM

## 2017-09-29 ENCOUNTER — Ambulatory Visit (INDEPENDENT_AMBULATORY_CARE_PROVIDER_SITE_OTHER): Payer: 59 | Admitting: Sports Medicine

## 2017-09-29 DIAGNOSIS — E6609 Other obesity due to excess calories: Secondary | ICD-10-CM | POA: Diagnosis not present

## 2017-09-29 DIAGNOSIS — I10 Essential (primary) hypertension: Secondary | ICD-10-CM | POA: Diagnosis not present

## 2017-09-29 MED ORDER — PHENTERMINE HCL 37.5 MG PO TABS: ORAL_TABLET | ORAL | 0 refills | Status: DC

## 2017-09-29 NOTE — Progress Notes (Signed)
  Subjective:    CC: Follow-up  HPI: Morbid obesity: Additional 5 pound weight loss after the second month.  Hypertension: Feeling somewhat tired, blood pressure is in the 90s systolic.  No syncope or presyncope.  Past medical history:  Negative.  See flowsheet/record as well for more information.  Surgical history: Negative.  See flowsheet/record as well for more information.  Family history: Negative.  See flowsheet/record as well for more information.  Social history: Negative.  See flowsheet/record as well for more information.  Allergies, and medications have been entered into the medical record, reviewed, and no changes needed.   Review of Systems: No fevers, chills, night sweats, weight loss, chest pain, or shortness of breath.   Objective:    General: Well Developed, well nourished, and in no acute distress.  Neuro: Alert and oriented x3, extra-ocular muscles intact, sensation grossly intact.  HEENT: Normocephalic, atraumatic, pupils equal round reactive to light, neck supple, no masses, no lymphadenopathy, thyroid nonpalpable.  Skin: Warm and dry, no rashes. Cardiac: Regular rate and rhythm, no murmurs rubs or gallops, no lower extremity edema.  Respiratory: Clear to auscultation bilaterally. Not using accessory muscles, speaking in full sentences.  Impression and Recommendations:    Obesity 5 additional pound weight loss, entering the third month.  Essential hypertension, benign With weight losses, improved control of his blood pressure, decreasing blood pressure medicine to one half tab daily, return in 1 month to recheck.  I spent 25 minutes with this patient, greater than 50% was face-to-face time counseling regarding the above diagnoses ___________________________________________ Ihor Austinhomas J. Benjamin Stainhekkekandam, M.D., ABFM., CAQSM. Primary Care and Sports Medicine [place] [facility]  Adjunct Instructor of Family Medicine  [employer]

## 2017-09-29 NOTE — Assessment & Plan Note (Signed)
With weight losses, improved control of his blood pressure, decreasing blood pressure medicine to one half tab daily, return in 1 month to recheck.

## 2017-09-29 NOTE — Assessment & Plan Note (Signed)
5 additional pound weight loss, entering the third month.

## 2017-10-27 ENCOUNTER — Ambulatory Visit: Payer: 59 | Admitting: Sports Medicine

## 2017-11-29 ENCOUNTER — Other Ambulatory Visit: Payer: Self-pay | Admitting: Sports Medicine

## 2017-11-29 DIAGNOSIS — I10 Essential (primary) hypertension: Secondary | ICD-10-CM

## 2018-04-18 ENCOUNTER — Ambulatory Visit (INDEPENDENT_AMBULATORY_CARE_PROVIDER_SITE_OTHER): Payer: 59 | Admitting: Family Medicine

## 2018-04-18 ENCOUNTER — Encounter: Payer: Self-pay | Admitting: Family Medicine

## 2018-04-18 VITALS — BP 115/77 | HR 64 | Ht 73.0 in | Wt 273.0 lb

## 2018-04-18 DIAGNOSIS — S39012A Strain of muscle, fascia and tendon of lower back, initial encounter: Secondary | ICD-10-CM | POA: Diagnosis not present

## 2018-04-18 MED ORDER — CYCLOBENZAPRINE HCL 10 MG PO TABS: 10.0000 mg | ORAL_TABLET | Freq: Three times a day (TID) | ORAL | 0 refills | Status: DC | PRN

## 2018-04-18 NOTE — Patient Instructions (Signed)
Thank you for coming in today. Stay mildly acitive.  Use a heating pad.  Use TENS unit.  Attend PT.  Recheck if not getting better in 6 weeks.  Return sooner if worsening.  Come back or go to the emergency room if you notice new weakness new numbness problems walking or bowel or bladder problems.  TENS UNIT: This is helpful for muscle pain and spasm.   Search and Purchase a TENS 7000 2nd edition at  www.tenspros.com or www.Amazon.com It should be less than $30.     TENS unit instructions: Do not shower or bathe with the unit on Turn the unit off before removing electrodes or batteries If the electrodes lose stickiness add a drop of water to the electrodes after they are disconnected from the unit and place on plastic sheet. If you continued to have difficulty, call the TENS unit company to purchase more electrodes. Do not apply lotion on the skin area prior to use. Make sure the skin is clean and dry as this will help prolong the life of the electrodes. After use, always check skin for unusual red areas, rash or other skin difficulties. If there are any skin problems, does not apply electrodes to the same area. Never remove the electrodes from the unit by pulling the wires. Do not use the TENS unit or electrodes other than as directed. Do not change electrode placement without consultating your therapist or physician. Keep 2 fingers with between each electrode. Wear time ratio is 2:1, on to off times.    For example on for 30 minutes off for 15 minutes and then on for 30 minutes off for 15 minutes    Lumbosacral Strain Lumbosacral strain is an injury that causes pain in the lower back (lumbosacral spine). This injury usually occurs from overstretching the muscles or ligaments along your spine. A strain can affect one or more muscles or cord-like tissues that connect bones to other bones (ligaments). What are the causes? This condition may be caused by:  A hard, direct hit (blow)  to the back.  Excessive stretching of the lower back muscles. This may result from: ? A fall. ? Lifting something heavy. ? Repetitive movements such as bending or crouching.  What increases the risk? The following factors may increase your risk of getting this condition:  Participating in sports or activities that involve: ? A sudden twist of the back. ? Pushing or pulling motions.  Being overweight or obese.  Having poor strength and flexibility, especially tight hamstrings or weak muscles in the back or abdomen.  Having too much of a curve in the lower back.  Having a pelvis that is tilted forward.  What are the signs or symptoms? The main symptom of this condition is pain in the lower back, at the site of the strain. Pain may extend (radiate) down one or both legs. How is this diagnosed? This condition is diagnosed based on:  Your symptoms.  Your medical history.  A physical exam. ? Your health care provider may push on certain areas of your back to determine the source of your pain. ? You may be asked to bend forward, backward, and side to side to assess the severity of your pain and your range of motion.  Imaging tests, such as: ? X-rays. ? MRI.  How is this treated? Treatment for this condition may include:  Putting heat and cold on the affected area.  Medicines to help relieve pain and relax your muscles (muscle relaxants).  NSAIDs to help reduce swelling and discomfort.  When your symptoms improve, it is important to gradually return to your normal routine as soon as possible to reduce pain, avoid stiffness, and avoid loss of muscle strength. Generally, symptoms should improve within 6 weeks of treatment. However, recovery time varies. Follow these instructions at home: Managing pain, stiffness, and swelling   If directed, put ice on the injured area during the first 24 hours after your strain. ? Put ice in a plastic bag. ? Place a towel between your  skin and the bag. ? Leave the ice on for 20 minutes, 2-3 times a day.  If directed, put heat on the affected area as often as told by your health care provider. Use the heat source that your health care provider recommends, such as a moist heat pack or a heating pad. ? Place a towel between your skin and the heat source. ? Leave the heat on for 20-30 minutes. ? Remove the heat if your skin turns bright red. This is especially important if you are unable to feel pain, heat, or cold. You may have a greater risk of getting burned. Activity  Rest and return to your normal activities as told by your health care provider. Ask your health care provider what activities are safe for you.  Avoid activities that take a lot of energy for as long as told by your health care provider. General instructions  Take over-the-counter and prescription medicines only as told by your health care provider.  Donot drive or use heavy machinery while taking prescription pain medicine.  Do not use any products that contain nicotine or tobacco, such as cigarettes and e-cigarettes. If you need help quitting, ask your health care provider.  Keep all follow-up visits as told by your health care provider. This is important. How is this prevented?  Use correct form when playing sports and lifting heavy objects.  Use good posture when sitting and standing.  Maintain a healthy weight.  Sleep on a mattress with medium firmness to support your back.  Be safe and responsible while being active to avoid falls.  Do at least 150 minutes of moderate-intensity exercise each week, such as brisk walking or water aerobics. Try a form of exercise that takes stress off your back, such as swimming or stationary cycling.  Maintain physical fitness, including: ? Strength. ? Flexibility. ? Cardiovascular fitness. ? Endurance. Contact a health care provider if:  Your back pain does not improve after 6 weeks of  treatment.  Your symptoms get worse. Get help right away if:  Your back pain is severe.  You cannot stand or walk.  You have difficulty controlling when you urinate or when you have a bowel movement.  You feel nauseous or you vomit.  Your feet get very cold.  You have numbness, tingling, weakness, or problems using your arms or legs.  You develop any of the following: ? Shortness of breath. ? Dizziness. ? Pain in your legs. ? Weakness in your buttocks or legs. ? Discoloration of the skin on your toes or legs. This information is not intended to replace advice given to you by your health care provider. Make sure you discuss any questions you have with your health care provider. Document Released: 08/24/2005 Document Revised: 06/03/2016 Document Reviewed: 04/17/2016 Elsevier Interactive Patient Education  [employer].

## 2018-04-18 NOTE — Progress Notes (Signed)
   Alan Gardner is a 47 y.o. male who presents to [facility] today for back pain.  Patient notes a few day history of right low back pain.  He denies any radiating pain weakness or numbness.  He denies any specific injury.  He notes his pain worsened after he moved some furniture.  He has not had any treatment yet.  No fevers chills nausea vomiting or diarrhea.    ROS:  As above  Exam:  BP 115/77   Pulse 64   Ht  (1.854 m)   Wt 273 lb (123.8 kg)   BMI 36.02 kg/m  General: Well Developed, well nourished, and in no acute distress.  Neuro/Psych: Alert and oriented x3, extra-ocular muscles intact, able to move all 4 extremities, sensation grossly intact. Skin: Warm and dry, no rashes noted.  Respiratory: Not using accessory muscles, speaking in full sentences, trachea midline.  Cardiovascular: Pulses palpable, no extremity edema. Abdomen: Does not appear distended. MSK: L-spine nontender to midline.  Tender palpation right lumbar paraspinal muscle group. Range of motion normal flexion and rotation.  Pain with left lateral flexion and extension. Lower extremity strength reflexes sensation are intact.  Capillary refill is intact distally.  CT scan abdomen pelvis 2011 images reviewed revealing minimal lumbar DDD.  Assessment and Plan: 47 y.o. male with  Low back pain due to myofascial strain and dysfunction.  Plan for prescription strength NSAIDs, Flexeril, heating pad, TENS unit.  Additionally refer to physical therapy.  Recheck in a few weeks if not improving.  Warning signs discussed.    Orders Placed This Encounter  Procedures  . Ambulatory referral to Physical Therapy    Referral Priority:   Routine    Referral Type:   Physical Medicine    Referral Reason:   Specialty Services Required    Requested Specialty:   Physical Therapy   Meds ordered this encounter  Medications  . cyclobenzaprine (FLEXERIL) 10 MG tablet    Sig: Take  1 tablet (10 mg total) by mouth 3 (three) times daily as needed for muscle spasms.    Dispense:  30 tablet    Refill:  0    Historical information moved to improve visibility of documentation.  Past Medical History:  Diagnosis Date  . Hyperlipemia   . Hypertension    Past Surgical History:  Procedure Laterality Date  . KIDNEY STONE SURGERY     Social History   Tobacco Use  . Smoking status: Never Smoker  . Smokeless tobacco: Never Used  Substance Use Topics  . Alcohol use: Yes   family history includes Hypertension in his mother.  Medications: Current Outpatient Medications  Medication Sig Dispense Refill  . levothyroxine (SYNTHROID, LEVOTHROID) 50 MCG tablet Take 1 tablet (50 mcg total) by mouth daily before breakfast. 90 tablet 0  . lisinopril-hydrochlorothiazide (PRINZIDE,ZESTORETIC) 20-12.5 MG tablet TAKE 1 TABLET BY MOUTH DAILY. FOLLOW UP VISIT 30 tablet 4  . metroNIDAZOLE (METROGEL) 0.75 % gel Apply 1 application topically 2 (two) times daily. 45 g 3  . cyclobenzaprine (FLEXERIL) 10 MG tablet Take 1 tablet (10 mg total) by mouth 3 (three) times daily as needed for muscle spasms. 30 tablet 0   No current facility-administered medications for this visit.    No Known Allergies    Discussed warning signs or symptoms. Please see discharge instructions. Patient expresses understanding.

## 2019-01-07 ENCOUNTER — Other Ambulatory Visit: Payer: Self-pay | Admitting: Sports Medicine

## 2019-01-07 DIAGNOSIS — I10 Essential (primary) hypertension: Secondary | ICD-10-CM

## 2019-01-07 DIAGNOSIS — L719 Rosacea, unspecified: Secondary | ICD-10-CM

## 2019-02-22 ENCOUNTER — Encounter: Payer: 59 | Admitting: Sports Medicine

## 2019-02-22 ENCOUNTER — Other Ambulatory Visit: Payer: Self-pay | Admitting: Sports Medicine

## 2019-02-22 DIAGNOSIS — I10 Essential (primary) hypertension: Secondary | ICD-10-CM

## 2019-04-15 ENCOUNTER — Telehealth: Payer: Self-pay | Admitting: *Deleted

## 2019-04-15 NOTE — Telephone Encounter (Signed)
Pt has already been scheduled somewhere else.

## 2019-04-15 NOTE — Telephone Encounter (Signed)
Pt left vm stating that his wife has had covid and his employer is requesting that he gets tested before returning to work.  Do you want him to schedule a drive-by for this? Please advise.

## 2019-04-15 NOTE — Telephone Encounter (Signed)
Yep!  Lets do it tomorrow.

## 2019-08-14 ENCOUNTER — Ambulatory Visit (INDEPENDENT_AMBULATORY_CARE_PROVIDER_SITE_OTHER): Payer: 59 | Admitting: Sports Medicine

## 2019-08-14 ENCOUNTER — Telehealth: Payer: Self-pay | Admitting: Sports Medicine

## 2019-08-14 ENCOUNTER — Other Ambulatory Visit: Payer: Self-pay

## 2019-08-14 ENCOUNTER — Ambulatory Visit (INDEPENDENT_AMBULATORY_CARE_PROVIDER_SITE_OTHER): Payer: 59

## 2019-08-14 DIAGNOSIS — E039 Hypothyroidism, unspecified: Secondary | ICD-10-CM | POA: Diagnosis not present

## 2019-08-14 DIAGNOSIS — M48061 Spinal stenosis, lumbar region without neurogenic claudication: Secondary | ICD-10-CM

## 2019-08-14 DIAGNOSIS — E7800 Pure hypercholesterolemia, unspecified: Secondary | ICD-10-CM

## 2019-08-14 DIAGNOSIS — E038 Other specified hypothyroidism: Secondary | ICD-10-CM

## 2019-08-14 DIAGNOSIS — R7303 Prediabetes: Secondary | ICD-10-CM

## 2019-08-14 MED ORDER — PREDNISONE 50 MG PO TABS
ORAL_TABLET | ORAL | 0 refills | Status: DC
Start: 2019-08-14 — End: 2021-02-02

## 2019-08-14 NOTE — Progress Notes (Addendum)
Subjective:    CC: Acute low back pain  HPI: This is a pleasant 48 year old male, recently he bent over to put on his shoe, he had a pop in his back and immediate pain, no numbness or tingling, no bowel or bladder dysfunction, saddle numbness, constitutional symptoms.  Pain is worse with leaning back, better with leaning forward.  He is also due for routine labs.  I reviewed the past medical history, family history, social history, surgical history, and allergies today and no changes were needed.  Please see the problem list section below in epic for further details.  Past Medical History: Past Medical History:  Diagnosis Date  . Hyperlipemia   . Hypertension    Past Surgical History: Past Surgical History:  Procedure Laterality Date  . KIDNEY STONE SURGERY     Social History: Social History   Socioeconomic History  . Marital status: Married    Spouse name: Not on file  . Number of children: Not on file  . Years of education: Not on file  . Highest education level: Not on file  Occupational History  . Not on file  Social Needs  . Financial resource strain: Not on file  . Food insecurity    Worry: Not on file    Inability: Not on file  . Transportation needs    Medical: Not on file    Non-medical: Not on file  Tobacco Use  . Smoking status: Never Smoker  . Smokeless tobacco: Never Used  Substance and Sexual Activity  . Alcohol use: Yes  . Drug use: No  . Sexual activity: Not on file  Lifestyle  . Physical activity    Days per week: Not on file    Minutes per session: Not on file  . Stress: Not on file  Relationships  . Social Musicianconnections    Talks on phone: Not on file    Gets together: Not on file    Attends religious service: Not on file    Active member of club or organization: Not on file    Attends meetings of clubs or organizations: Not on file    Relationship status: Not on file  Other Topics Concern  . Not on file  Social History Narrative  . Not  on file   Family History: Family History  Problem Relation Age of Onset  . Hypertension Mother    Allergies: No Known Allergies Medications: See med rec.  Review of Systems: No fevers, chills, night sweats, weight loss, chest pain, or shortness of breath.   Objective:    General: Well Developed, well nourished, and in no acute distress.  Neuro: Alert and oriented x3, extra-ocular muscles intact, sensation grossly intact.  HEENT: Normocephalic, atraumatic, pupils equal round reactive to light, neck supple, no masses, no lymphadenopathy, thyroid nonpalpable.  Skin: Warm and dry, no rashes. Cardiac: Regular rate and rhythm, no murmurs rubs or gallops, no lower extremity edema.  Respiratory: Clear to auscultation bilaterally. Not using accessory muscles, speaking in full sentences. Back Exam:  Inspection: Unremarkable  Motion: Flexion 45 deg, Extension 45 deg, Side Bending to 45 deg bilaterally,  Rotation to 45 deg bilaterally  SLR laying: Negative  XSLR laying: Negative  Palpable tenderness: None. FABER: negative. Sensory change: Gross sensation intact to all lumbar and sacral dermatomes.  Reflexes: 2+ at both patellar tendons, 2+ at achilles tendons, Babinski's downgoing.  Strength at foot  Plantar-flexion: 5/5 Dorsi-flexion: 5/5 Eversion: 5/5 Inversion: 5/5  Leg strength  Quad: 5/5 Hamstring: 5/5  Hip flexor: 5/5 Hip abductors: 5/5  Gait unremarkable.  Impression and Recommendations:    Lumbar spinal stenosis X-rays, prednisone, formal PT, CT from 2011 showed moderate L4-L5 central canal stenosis. Return to see me in 6 weeks, MR for interventional planning if no better.  Subclinical hypothyroidism Elevated TSH but noncompliant with levothyroxine. Recheck in 6 weeks.  Hyperlipidemia Starting low-dose atorvastatin.   ___________________________________________ Gwen Her. Dianah Field, M.D., ABFM., CAQSM. Primary Care and Sports Medicine [facility]  [facility]  Adjunct Professor of [place] [employer] [facility] [employer] [facility] [employer]

## 2019-08-14 NOTE — Telephone Encounter (Signed)
Letter written.  Okay to print from my chart.

## 2019-08-14 NOTE — Telephone Encounter (Signed)
Patient needs a note for work. He would like to stay out until the 18th of September. Also mentioned light duty but didn't give a specific time.  Please hand letter to me so I can email it to him.  Please advise.

## 2019-08-14 NOTE — Assessment & Plan Note (Signed)
X-rays, prednisone, formal PT, CT from 2011 showed moderate L4-L5 central canal stenosis. Return to see me in 6 weeks, MR for interventional planning if no better.

## 2019-08-15 ENCOUNTER — Other Ambulatory Visit: Payer: Self-pay | Admitting: *Deleted

## 2019-08-15 ENCOUNTER — Other Ambulatory Visit: Payer: Self-pay | Admitting: Sports Medicine

## 2019-08-15 DIAGNOSIS — E038 Other specified hypothyroidism: Secondary | ICD-10-CM

## 2019-08-15 DIAGNOSIS — L719 Rosacea, unspecified: Secondary | ICD-10-CM

## 2019-08-15 DIAGNOSIS — I10 Essential (primary) hypertension: Secondary | ICD-10-CM

## 2019-08-15 DIAGNOSIS — E039 Hypothyroidism, unspecified: Secondary | ICD-10-CM

## 2019-08-15 LAB — COMPLETE METABOLIC PANEL WITH GFR
AG Ratio: 1.3 (calc) (ref 1.0–2.5)
ALT: 21 U/L (ref 9–46)
AST: 17 U/L (ref 10–40)
Albumin: 4.2 g/dL (ref 3.6–5.1)
Alkaline phosphatase (APISO): 79 U/L (ref 36–130)
BUN: 18 mg/dL (ref 7–25)
CO2: 23 mmol/L (ref 20–32)
Calcium: 9.5 mg/dL (ref 8.6–10.3)
Chloride: 107 mmol/L (ref 98–110)
Creat: 0.93 mg/dL (ref 0.60–1.35)
GFR, Est African American: 112 mL/min/{1.73_m2} (ref >=60)
GFR, Est Non African American: 97 mL/min/{1.73_m2} (ref >=60)
Globulin: 3.2 g/dL (calc) (ref 1.9–3.7)
Glucose, Bld: 84 mg/dL (ref 65–99)
Potassium: 4.8 mmol/L (ref 3.5–5.3)
Sodium: 141 mmol/L (ref 135–146)
Total Bilirubin: 0.7 mg/dL (ref 0.2–1.2)
Total Protein: 7.4 g/dL (ref 6.1–8.1)

## 2019-08-15 LAB — CBC
HCT: 45 % (ref 38.5–50.0)
Hemoglobin: 15.5 g/dL (ref 13.2–17.1)
MCH: 29.1 pg (ref 27.0–33.0)
MCHC: 34.4 g/dL (ref 32.0–36.0)
MCV: 84.6 fL (ref 80.0–100.0)
MPV: 8.6 fL (ref 7.5–12.5)
Platelets: 329 10*3/uL (ref 140–400)
RBC: 5.32 10*6/uL (ref 4.20–5.80)
RDW: 13.1 % (ref 11.0–15.0)
WBC: 11.4 10*3/uL — ABNORMAL HIGH (ref 3.8–10.8)

## 2019-08-15 LAB — LIPID PANEL W/REFLEX DIRECT LDL
Cholesterol: 210 mg/dL — ABNORMAL HIGH (ref <200)
HDL: 38 mg/dL — ABNORMAL LOW (ref >=40)
LDL Cholesterol (Calc): 145 mg/dL (calc) — ABNORMAL HIGH
Non-HDL Cholesterol (Calc): 172 mg/dL (calc) — ABNORMAL HIGH (ref <130)
Total CHOL/HDL Ratio: 5.5 (calc) — ABNORMAL HIGH (ref <5.0)
Triglycerides: 138 mg/dL (ref <150)

## 2019-08-15 LAB — HEMOGLOBIN A1C
Hgb A1c MFr Bld: 5.3 % of total Hgb (ref <5.7)
Mean Plasma Glucose: 105 (calc)
eAG (mmol/L): 5.8 (calc)

## 2019-08-15 LAB — TSH: TSH: 5.44 mIU/L — ABNORMAL HIGH (ref 0.40–4.50)

## 2019-08-15 LAB — VITAMIN D 25 HYDROXY (VIT D DEFICIENCY, FRACTURES): Vit D, 25-Hydroxy: 13 ng/mL — ABNORMAL LOW (ref 30–100)

## 2019-08-15 MED ORDER — VITAMIN D (ERGOCALCIFEROL) 1.25 MG (50000 UNIT) PO CAPS: 50000.0000 [IU] | ORAL_CAPSULE | ORAL | 0 refills | Status: DC

## 2019-08-15 MED ORDER — LEVOTHYROXINE SODIUM 50 MCG PO TABS: 50.0000 ug | ORAL_TABLET | Freq: Every day | ORAL | 0 refills | Status: DC

## 2019-08-15 MED ORDER — ATORVASTATIN CALCIUM 10 MG PO TABS: 10.0000 mg | ORAL_TABLET | Freq: Every day | ORAL | 3 refills | Status: DC

## 2019-08-15 MED ORDER — METRONIDAZOLE 0.75 % EX GEL: 1.0000 "application " | Freq: Two times a day (BID) | CUTANEOUS | 3 refills | Status: DC

## 2019-08-15 NOTE — Addendum Note (Signed)
Addended by: Silverio Decamp on: 08/15/2019 08:35 AM   Modules accepted: Orders

## 2019-08-15 NOTE — Assessment & Plan Note (Signed)
Elevated TSH but noncompliant with levothyroxine. Recheck in 6 weeks.

## 2019-08-15 NOTE — Addendum Note (Signed)
Addended by: Silverio Decamp on: 08/15/2019 01:13 PM   Modules accepted: Orders

## 2019-08-15 NOTE — Assessment & Plan Note (Signed)
Starting low-dose atorvastatin.

## 2019-08-21 ENCOUNTER — Encounter: Payer: Self-pay | Admitting: Sports Medicine

## 2019-08-23 ENCOUNTER — Other Ambulatory Visit: Payer: Self-pay

## 2019-08-23 ENCOUNTER — Encounter: Payer: Self-pay | Admitting: Physical Therapy

## 2019-08-23 ENCOUNTER — Ambulatory Visit (INDEPENDENT_AMBULATORY_CARE_PROVIDER_SITE_OTHER): Payer: 59 | Admitting: Physical Therapy

## 2019-08-23 DIAGNOSIS — M5441 Lumbago with sciatica, right side: Secondary | ICD-10-CM

## 2019-08-23 DIAGNOSIS — R293 Abnormal posture: Secondary | ICD-10-CM | POA: Diagnosis not present

## 2019-08-23 NOTE — Patient Instructions (Signed)
Access Code: [email or web address]  URL: [email or web address]  Date: 08/23/2019  Prepared by: Faustino Congress   Exercises  Right Standing Lateral Shift Correction at [place] - 10 reps - 1 sets - 10 sec hold - 3x daily - 7x weekly  Prone on Elbows Stretch - 1 reps - 1 sets - 3 min hold - 3x daily - 7x weekly  Prone Press Up on Elbows - 10 reps - 1 sets - 10 sec hold - 3x daily - 7x weekly  Seated Hamstring Stretch - 3 reps - 1 sets - 30 sec hold - 2x daily - 7x weekly  Seated Piriformis Stretch - 3 reps - 1 sets - 30 sec hold - 2x daily - 7x weekly  Hip Neural Slide/Glide - 10 reps - 1 sets - 5-10 sec hold - 2x daily - 7x weekly  Supine Sciatic Nerve Glide - 10 reps - 1 sets - 5-10 sec hold - 2x daily - 7x weekly

## 2019-08-23 NOTE — Therapy (Signed)
[facility] [place], [place], [address] Phone: [phone]   Fax:  [phone]  Physical Therapy Evaluation  Patient Details  Name: Alan Gardner MRN: [id number] Date of Birth: July 31, 1971 Referring Provider (PT): Silverio Decamp, MD   Encounter Date: 08/23/2019  PT End of Session - 08/23/19 0917    Visit Number  1    Number of Visits  12    Date for PT Re-Evaluation  10/04/19    PT Start Time  0842    PT Stop Time  0924    PT Time Calculation (min)  42 min    Activity Tolerance  Patient tolerated treatment well    Behavior During Therapy  [facility] for tasks assessed/performed       Past Medical History:  Diagnosis Date  . Hyperlipemia   . Hypertension     Past Surgical History:  Procedure Laterality Date  . KIDNEY STONE SURGERY      There were no vitals filed for this visit.   Subjective Assessment - 08/23/19 0844    Subjective  Pt is a 48 y/o male who presents to OPPT for LBP which began 08/12/19.  Pt states he was getting ready for work, bent over to put on his shoes and felt a "pop" and pain. Pt reports some residual pain which has improved.  Pain is isolated to lower Rt side of back, radiating towards upper buttocks.  Pt went to MD and rx prednisone which helped with symptoms.    Limitations  Walking    How long can you walk comfortably?  if flared up: painful    Diagnostic tests  xrays: Mild degenerative disc disease is noted at L2-3    Patient Stated Goals  improve pain    Currently in Pain?  Yes    Pain Score  1    up to 8/10; at best 1/10   Pain Location  Back    Pain Orientation  Right;Lower    Pain Descriptors / Indicators  Sharp;Aching;Discomfort;Dull    Pain Type  Acute pain    Pain Onset  1 to 4 weeks ago    Pain Frequency  Intermittent    Aggravating Factors   walking, work    Pain Relieving Factors  prednisone, siting, rest         [facility] PT Assessment - 08/23/19 0849      Assessment   Medical Diagnosis  M48.061 (ICD-10-CM) - Spinal stenosis of lumbar region without neurogenic claudication    Referring Provider (PT)  Silverio Decamp, MD    Onset Date/Surgical Date  08/12/19    Hand Dominance  Right    Next MD Visit  09/26/2019    Prior Therapy  at this clinic for shoulder      Precautions   Precautions  None      Restrictions   Weight Bearing Restrictions  No      Balance Screen   Has the patient fallen in the past 6 months  No    Has the patient had a decrease in activity level because of a fear of falling?   No    Is the patient reluctant to leave their home because of a fear of falling?   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#), bending, climbing    Leisure  travel, video games; no regular exercise      Cognition   Overall Cognitive Status  Within Functional Limits for tasks assessed      Observation/Other Assessments   Focus on Therapeutic Outcomes (FOTO)   63 (37% limited; predicted 21% limited)      Posture/Postural Control   Posture/Postural Control  Postural limitations    Postural Limitations  Rounded Shoulders;Forward head      ROM / Strength   AROM / PROM / Strength  AROM;Strength      AROM   Overall AROM Comments  slight pain with Rt quadrant on Rt side    AROM Assessment Site  Lumbar    Lumbar Flexion  limited 30%    Lumbar Extension  WNL    Lumbar - Right Side Bend  WNL      Strength   Strength Assessment Site  Hip;Knee;Ankle    Right/Left Hip  Right;Left    Right Hip Flexion  5/5    Left Hip Flexion  5/5    Right/Left Knee  Left;Right    Right Knee Extension  5/5    Left Knee  Extension  5/5    Right/Left Ankle  Right;Left    Right Ankle Dorsiflexion  5/5    Left Ankle Dorsiflexion  5/5      Flexibility   Soft Tissue Assessment /Muscle Length  yes    Hamstrings  significant tightness bil    Piriformis  tightness bil    Quadratus Lumborum  trigger points noted in Rt QL      Special Tests    Special Tests  Lumbar    Lumbar Tests  Slump Test;Straight Leg Raise      Slump test   Findings  Negative      Straight Leg Raise   Findings  Negative                Objective measurements completed on examination: See above findings.      [facility] Adult PT Treatment/Exercise - 08/23/19 0849      Self-Care   Self-Care  Other Self-Care Comments    Other Self-Care Comments   instructed in lateral shift/extension based exercises if radicular symptoms develop       Exercises   Exercises  Lumbar      Lumbar Exercises: Stretches   Passive Hamstring Stretch  Right;Left;1 rep;30 seconds    Passive Hamstring Stretch Limitations  seated    Single Knee to Chest Stretch  Right;5 reps;10 seconds    Single Knee to Chest Stretch Limitations  neural slide/glide    Piriformis Stretch  Right;Left;1 rep;30 seconds    Piriformis Stretch Limitations  seated    Other Lumbar Stretch Exercise  sciatic nerve flossing with ankle pump 5x5 sec; RLE             PT Education - 08/23/19 0931    Education Details  HEP    Person(s) Educated  Patient    Methods  Explanation;Demonstration;Handout    Comprehension  Verbalized understanding;Returned demonstration;Need further instruction          PT Long Term Goals - 08/23/19 0935      PT LONG TERM GOAL #1   Title  independent with HEP  Status  New    Target Date  10/04/19      PT LONG TERM GOAL #2   Title  FOTO score improved to </= 21% limited    Status  New    Target Date  10/04/19      PT LONG TERM GOAL #3   Title  report ability to tolerate full work day with pain < 3/10    Status  New    Target  Date  10/04/19      PT LONG TERM GOAL #4   Title  report ability to walk at least 30-45 min without increase in pain    Status  New    Target Date  10/04/19      PT LONG TERM GOAL #5   Title  n/a             Plan - 08/23/19 0917    Clinical Impression Statement  Pt is a 48 y/o male who presents to OPPT for acute LBP.  Pt demonstrates decreased flexibility with trigger points noted in QL and paraspinals on Rt, as well as some mild radicular symptoms which appear to have now centralized.  Pt will benefit from PT to address deficits listed.    Personal Factors and Comorbidities  Comorbidity 3+    Comorbidities  HTN, spinal stenosis, obesity    Examination-Activity Limitations  Locomotion Level;Carry;Squat;Lift    Examination-Participation Restrictions  Other   work   Stability/Clinical Decision Making  Stable/Uncomplicated    Clinical Decision Making  Low    Rehab Potential  Good    PT Frequency  2x / week    PT Duration  6 weeks    PT Treatment/Interventions  ADLs/Self Care Home Management;Cryotherapy;Electrical Stimulation;Ultrasound;Traction;Moist Heat;Therapeutic activities;Therapeutic exercise;Patient/family education;Manual techniques;Taping;Dry needling    PT Next Visit Plan  review HEP, manual/modalities/DN PRN; progress core/hip strengthening    PT Home Exercise Plan  Access Code: [email or web address]    Consulted and Agree with Plan of Care  Patient       Patient will benefit from skilled therapeutic intervention in order to improve the following deficits and impairments:  Decreased range of motion, Increased fascial restricitons, Increased muscle spasms, Obesity, Pain, Postural dysfunction, Impaired flexibility  Visit Diagnosis: Acute right-sided low back pain with right-sided sciatica - Plan: PT plan of care cert/re-cert  Abnormal posture - Plan: PT plan of care cert/re-cert     Problem List Patient Active Problem List   Diagnosis Date Noted  . Lumbar spinal stenosis  08/14/2019  . Rosacea 08/04/2017  . History of cholecystectomy 02/05/2016  . Subclinical hypothyroidism 09/08/2015  . Prediabetes 09/08/2015  . Obesity 09/08/2015  . Right thyroid nodule 09/07/2015  . Hyperlipidemia 10/17/2014  . Bilateral knee pain 06/07/2013  . Annual physical exam 05/09/2013  . Essential hypertension, benign 05/09/2013      [name], PT, DPT 08/23/19 9:39 AM     [facility] [address] [place], [place], [address] Phone: [phone]   Fax:  [phone]  Name: Alan Gardner MRN: [id number] Date of Birth: June 28, 1971

## 2019-08-30 ENCOUNTER — Ambulatory Visit (INDEPENDENT_AMBULATORY_CARE_PROVIDER_SITE_OTHER): Payer: 59 | Admitting: Physical Therapy

## 2019-08-30 ENCOUNTER — Other Ambulatory Visit: Payer: Self-pay

## 2019-08-30 ENCOUNTER — Encounter: Payer: Self-pay | Admitting: Physical Therapy

## 2019-08-30 DIAGNOSIS — M5441 Lumbago with sciatica, right side: Secondary | ICD-10-CM | POA: Diagnosis not present

## 2019-08-30 DIAGNOSIS — R293 Abnormal posture: Secondary | ICD-10-CM

## 2019-08-30 NOTE — Patient Instructions (Signed)
Access Code: [email or web address]  URL: [email or web address]  Date: 08/30/2019  Prepared by: Faustino Congress   Exercises  Right Standing Lateral Shift Correction at [place] - 10 reps - 1 sets - 10 sec hold - 3x daily - 7x weekly  Prone on Elbows Stretch - 1 reps - 1 sets - 3 min hold - 3x daily - 7x weekly  Prone Press Up on Elbows - 10 reps - 1 sets - 10 sec hold - 3x daily - 7x weekly  Seated Piriformis Stretch - 3 reps - 1 sets - 30 sec hold - 2x daily - 7x weekly  Hip Neural Slide/Glide - 10 reps - 1 sets - 5-10 sec hold - 2x daily - 7x weekly  Supine Sciatic Nerve Glide - 10 reps - 1 sets - 5-10 sec hold - 2x daily - 7x weekly  Seated Hamstring Stretch - 3 reps - 1 sets - 30 sec hold - 2x daily - 7x weekly  Standing Forward Trunk Flexion - 10 reps - 1 sets - 10 sec hold - 2x daily - 7x weekly

## 2019-08-30 NOTE — Therapy (Signed)
Alan Gardner [place], [place], [address] Phone: [phone]   Fax:  [phone]  Physical Therapy Treatment  Patient Details  Name: Alan Gardner MRN: [id number] Date of Birth: 04-25-1971 Referring Provider (PT): Silverio Decamp, MD   Encounter Date: 08/30/2019  PT End of Session - 08/30/19 1055    Visit Number  2    Number of Visits  12    Date for PT Re-Evaluation  10/04/19    PT Start Time  9371    PT Stop Time  1056    PT Time Calculation (min)  41 min    Activity Tolerance  Patient tolerated treatment well    Behavior During Therapy  [facility] for tasks assessed/performed       Past Medical History:  Diagnosis Date  . Hyperlipemia   . Hypertension     Past Surgical History:  Procedure Laterality Date  . KIDNEY STONE SURGERY      There were no vitals filed for this visit.  Subjective Assessment - 08/30/19 1017    Subjective  overall doing well; hamstring stretches are tough due to tightness    Limitations  Walking    How long can you walk comfortably?  if flared up: painful    Diagnostic tests  xrays: Mild degenerative disc disease is noted at L2-3    Patient Stated Goals  improve pain    Pain Score  0-No pain    Pain Onset  1 to 4 weeks ago                       [facility] Adult PT Treatment/Exercise - 08/30/19 1018      Lumbar Exercises: Stretches   Passive Hamstring Stretch  Right;Left;30 seconds;3 reps    Passive Hamstring Stretch Limitations  seated    Single Knee to Chest Stretch  Right;5 reps;10 seconds;Left    Single Knee to Chest Stretch Limitations  neural slide/glide    Piriformis Stretch  Right;Left;30 seconds;3 reps    Piriformis Stretch Limitations  seated    Other Lumbar Stretch Exercise  sciatic nerve flossing with ankle pump 5x5 sec; bil LEs    Other Lumbar Stretch Exercise  standing forward roll 10x10 sec with orange physioball      Lumbar Exercises:  Aerobic   Nustep  L5 x 6 min             PT Education - 08/30/19 1055    Education Details  HEP    Person(s) Educated  Patient    Methods  Explanation;Demonstration;Handout    Comprehension  Verbalized understanding;Returned demonstration          PT Long Term Goals - 08/23/19 0935      PT LONG TERM GOAL #1   Title  independent with HEP    Status  New    Target Date  10/04/19      PT LONG TERM GOAL #2   Title  FOTO score improved to </= 21% limited    Status  New    Target Date  10/04/19      PT LONG TERM GOAL #3   Title  report ability to tolerate full work day with pain < 3/10    Status  New    Target Date  10/04/19      PT LONG TERM GOAL #4   Title  report ability to walk at least 30-45 min without increase in pain  Status  New    Target Date  10/04/19      PT LONG TERM GOAL #5   Title  n/a            Plan - 08/30/19 1055    Clinical Impression Statement  Continuing to be free of radicular symptoms at this time, and initiated progression into flexion today.  Progressing well, and anticipate adding strengthening to HEP next visit.    Personal Factors and Comorbidities  Comorbidity 3+    Comorbidities  HTN, spinal stenosis, obesity    Examination-Activity Limitations  Locomotion Level;Carry;Squat;Lift    Examination-Participation Restrictions  Other   work   Stability/Clinical Decision Making  Stable/Uncomplicated    Rehab Potential  Good    PT Frequency  2x / week    PT Duration  6 weeks    PT Treatment/Interventions  ADLs/Self Care Home Management;Cryotherapy;Electrical Stimulation;Ultrasound;Traction;Moist Heat;Therapeutic activities;Therapeutic exercise;Patient/family education;Manual techniques;Taping;Dry needling    PT Next Visit Plan  review HEP, manual/modalities/DN PRN; progress core/hip strengthening    PT Home Exercise Plan  Access Code: [email or web address]    Consulted and Agree with Plan of Care  Patient       Patient will benefit from  skilled therapeutic intervention in order to improve the following deficits and impairments:  Decreased range of motion, Increased fascial restricitons, Increased muscle spasms, Obesity, Pain, Postural dysfunction, Impaired flexibility  Visit Diagnosis: Acute right-sided low back pain with right-sided sciatica  Abnormal posture     Problem List Patient Active Problem List   Diagnosis Date Noted  . Lumbar spinal stenosis 08/14/2019  . Rosacea 08/04/2017  . History of cholecystectomy 02/05/2016  . Subclinical hypothyroidism 09/08/2015  . Prediabetes 09/08/2015  . Obesity 09/08/2015  . Right thyroid nodule 09/07/2015  . Hyperlipidemia 10/17/2014  . Bilateral knee pain 06/07/2013  . Annual physical exam 05/09/2013  . Essential hypertension, benign 05/09/2013      [name], PT, DPT 08/30/19 10:58 AM     [facility] [address] [place], [place], [address] Phone: [phone]   Fax:  [phone]  Name: Alan Gardner MRN: [id number] Date of Birth: August 16, 1971

## 2019-09-04 ENCOUNTER — Ambulatory Visit (INDEPENDENT_AMBULATORY_CARE_PROVIDER_SITE_OTHER): Payer: 59 | Admitting: Physical Therapy

## 2019-09-04 ENCOUNTER — Encounter: Payer: Self-pay | Admitting: Physical Therapy

## 2019-09-04 ENCOUNTER — Other Ambulatory Visit: Payer: Self-pay

## 2019-09-04 DIAGNOSIS — M5441 Lumbago with sciatica, right side: Secondary | ICD-10-CM

## 2019-09-04 DIAGNOSIS — R293 Abnormal posture: Secondary | ICD-10-CM | POA: Diagnosis not present

## 2019-09-04 NOTE — Patient Instructions (Signed)
Access Code: [email or web address]  URL: [email or web address]  Date: 09/04/2019  Prepared by: Faustino Congress   Exercises  Right Standing Lateral Shift Correction at [place] - 10 reps - 1 sets - 10 sec hold - 3x daily - 7x weekly  Prone on Elbows Stretch - 1 reps - 1 sets - 3 min hold - 3x daily - 7x weekly  Prone Press Up on Elbows - 10 reps - 1 sets - 10 sec hold - 3x daily - 7x weekly  Seated Piriformis Stretch - 3 reps - 1 sets - 30 sec hold - 2x daily - 7x weekly  Hip Neural Slide/Glide - 10 reps - 1 sets - 5-10 sec hold - 2x daily - 7x weekly  Supine Sciatic Nerve Glide - 10 reps - 1 sets - 5-10 sec hold - 2x daily - 7x weekly  Seated Hamstring Stretch - 3 reps - 1 sets - 30 sec hold - 2x daily - 7x weekly  Standing Forward Trunk Flexion - 10 reps - 1 sets - 10 sec hold - 2x daily - 7x weekly  Patient Education  Trigger Point Dry Needling

## 2019-09-04 NOTE — Therapy (Signed)
[place], [place], [address] Phone: [phone]   Fax:  [phone]  Physical Therapy Treatment  Patient Details  Name: Alan Gardner MRN: [id number] Date of Birth: 1971-10-31 Referring Provider (PT): Silverio Decamp, MD   Encounter Date: 09/04/2019  PT End of Session - 09/04/19 1558    Visit Number  3    Number of Visits  12    Date for PT Re-Evaluation  10/04/19    PT Start Time  6962    PT Stop Time  1554    PT Time Calculation (min)  39 min    Activity Tolerance  Patient tolerated treatment well    Behavior During Therapy  [facility] for tasks assessed/performed       Past Medical History:  Diagnosis Date  . Hyperlipemia   . Hypertension     Past Surgical History:  Procedure Laterality Date  . KIDNEY STONE SURGERY      There were no vitals filed for this visit.  Subjective Assessment - 09/04/19 1515    Subjective  doing well; feels about 90% back to baseline.  bending over is still a little bit of a challenge.    Limitations  Walking    How long can you walk comfortably?  if flared up: painful    Diagnostic tests  xrays: Mild degenerative disc disease is noted at L2-3    Patient Stated Goals  improve pain    Pain Score  0-No pain    Pain Onset  1 to 4 weeks ago                       [facility] Adult PT Treatment/Exercise - 09/04/19 1515      Lumbar Exercises: Stretches   Quadruped Mid Back Stretch  3 reps;20 seconds   mid and to Lt   Other Lumbar Stretch Exercise  standing forward roll 10x10 sec with green physioball; lowering mat after 5 reps      Lumbar Exercises: Aerobic   Nustep  L5 x 6 min      Manual Therapy   Manual Therapy  Soft tissue mobilization    Manual therapy comments  skilled palpation and monitoring of soft tissue during DN    Soft tissue mobilization  Rt QL into glute med and lumbar paraspinals       Trigger Point Dry Needling - 09/04/19  1557    Consent Given?  Yes    Education Handout Provided  Yes    Muscles Treated Back/Hip  Gluteus medius    Electrical Stimulation Performed with Dry Needling  Yes    E-stim with Dry Needling Details  to glute med 10 mHz freq to intensity x 5-8 min    Gluteus Medius Response  Twitch response elicited;Palpable increased muscle length                PT Long Term Goals - 08/23/19 0935      PT LONG TERM GOAL #1   Title  independent with HEP    Status  New    Target Date  10/04/19      PT LONG TERM GOAL #2   Title  FOTO score improved to </= 21% limited    Status  New    Target Date  10/04/19      PT LONG TERM GOAL #3   Title  report ability to tolerate full work day with pain < 3/10  Status  New    Target Date  10/04/19      PT LONG TERM GOAL #4   Title  report ability to walk at least 30-45 min without increase in pain    Status  New    Target Date  10/04/19      PT LONG TERM GOAL #5   Title  n/a            Plan - 09/04/19 1558    Clinical Impression Statement  Pt reports he feels 90% back to baseline with improvement with forward bending today following DN and manual therapy.  Progressing well with PT, anticipate pt may be ready for d/c next 1-2 weeks.    Personal Factors and Comorbidities  Comorbidity 3+    Comorbidities  HTN, spinal stenosis, obesity    Examination-Activity Limitations  Locomotion Level;Carry;Squat;Lift    Examination-Participation Restrictions  Other   work   Stability/Clinical Decision Making  Stable/Uncomplicated    Rehab Potential  Good    PT Frequency  2x / week    PT Duration  6 weeks    PT Treatment/Interventions  ADLs/Self Care Home Management;Cryotherapy;Electrical Stimulation;Ultrasound;Traction;Moist Heat;Therapeutic activities;Therapeutic exercise;Patient/family education;Manual techniques;Taping;Dry needling    PT Next Visit Plan  review HEP, manual/modalities/DN PRN; progress core/hip strengthening; assess response to  DN    PT Home Exercise Plan  Access Code: [email or web address]    Consulted and Agree with Plan of Care  Patient       Patient will benefit from skilled therapeutic intervention in order to improve the following deficits and impairments:  Decreased range of motion, Increased fascial restricitons, Increased muscle spasms, Obesity, Pain, Postural dysfunction, Impaired flexibility  Visit Diagnosis: Acute right-sided low back pain with right-sided sciatica  Abnormal posture     Problem List Patient Active Problem List   Diagnosis Date Noted  . Lumbar spinal stenosis 08/14/2019  . Rosacea 08/04/2017  . History of cholecystectomy 02/05/2016  . Subclinical hypothyroidism 09/08/2015  . Prediabetes 09/08/2015  . Obesity 09/08/2015  . Right thyroid nodule 09/07/2015  . Hyperlipidemia 10/17/2014  . Bilateral knee pain 06/07/2013  . Annual physical exam 05/09/2013  . Essential hypertension, benign 05/09/2013      [name], PT, DPT 09/04/19 4:00 PM     [facility] [address] [place], [place], [address] Phone: [phone]   Fax:  [phone]  Name: Vernis Eid MRN: [id number] Date of Birth: 05/11/1971

## 2019-09-06 ENCOUNTER — Other Ambulatory Visit: Payer: Self-pay

## 2019-09-06 ENCOUNTER — Ambulatory Visit (INDEPENDENT_AMBULATORY_CARE_PROVIDER_SITE_OTHER): Payer: 59 | Admitting: Physical Therapy

## 2019-09-06 ENCOUNTER — Encounter: Payer: Self-pay | Admitting: Physical Therapy

## 2019-09-06 DIAGNOSIS — R293 Abnormal posture: Secondary | ICD-10-CM | POA: Diagnosis not present

## 2019-09-06 DIAGNOSIS — M5441 Lumbago with sciatica, right side: Secondary | ICD-10-CM

## 2019-09-06 NOTE — Patient Instructions (Signed)
Access Code: [email or web address]  URL: [email or web address]  Date: 09/06/2019  Prepared by: Kerin Perna   Exercises  Added:   Prone Alternating Arm and Leg Lifts - 5 reps - 2 sets - 1x daily - 3x weekly  Doorway Pec Stretch at 90 Degrees Abduction - 2-3 reps - 1 sets - 1x daily - 7x weekly  Standing 'L' Stretch at [employer] - 2-3 reps - 1 sets - 1x daily - 7x weekly  Shoulder Extension with Resistance - 10 reps - 2 sets - 2 hold - 1x daily - 3x weekly  Standing Row with Anchored Resistance - 10 reps - 2 sets - 2 hold - 1x daily - 3x weekly  Anti-Rotation Sidestepping with Resistance - 10 reps - 2 sets - 2 hold - 1x daily - 3x weekly

## 2019-09-06 NOTE — Therapy (Addendum)
[place], [place], [address] Phone: [phone]   Fax:  [phone]  Physical Therapy Treatment/Discharge Summary  Patient Details  Name: Alan Gardner MRN: [id number] Date of Birth: 09-Dec-1970 Referring Provider (PT): Silverio Decamp, MD   Encounter Date: 09/06/2019  PT End of Session - 09/06/19 0941    Visit Number  4    Number of Visits  12    Date for PT Re-Evaluation  10/04/19    PT Start Time  0933    PT Stop Time  1011    PT Time Calculation (min)  38 min    Activity Tolerance  Patient tolerated treatment well;No increased pain    Behavior During Therapy  WFL for tasks assessed/performed       Past Medical History:  Diagnosis Date  . Hyperlipemia   . Hypertension     Past Surgical History:  Procedure Laterality Date  . KIDNEY STONE SURGERY      There were no vitals filed for this visit.  Subjective Assessment - 09/06/19 0935    Subjective  doing well; has been off the last few days.  Last time he had pain was when he was putting shoes on 5 days ago.  He'd like to learn some strengthening exercises.    Limitations  Walking    How long can you walk comfortably?  if flared up: painful    Diagnostic tests  xrays: Mild degenerative disc disease is noted at L2-3    Patient Stated Goals  improve pain    Currently in Pain?  No/denies    Pain Score  0-No pain    Pain Onset  1 to 4 weeks ago         [facility] PT Assessment - 09/06/19 0001      Assessment   Medical Diagnosis  M48.061 (ICD-10-CM) - Spinal stenosis of lumbar region without neurogenic claudication    Referring Provider (PT)  Silverio Decamp, MD    Onset Date/Surgical Date  08/12/19    Hand Dominance  Right    Next MD Visit  09/26/2019    Prior Therapy  at this clinic for shoulder        [facility] Adult PT Treatment/Exercise - 09/06/19 0001      Lumbar Exercises: Stretches   Passive Hamstring Stretch   Right;Left;2 reps;20 seconds   seated   Passive Hamstring Stretch Limitations  cues for straight back     Single Knee to Chest Stretch Limitations  verbally reviewed neural glides for supine    Other Lumbar Stretch Exercise  standing lat stretch with hands on rail x 10 sec x 4 reps;  mid level doorway stretch x 30 sec x 2 reps       Lumbar Exercises: Aerobic   Nustep  L5 x 5.5 min      Lumbar Exercises: Standing   Row  Strengthening;Both;10 reps;Theraband    Theraband Level (Row)  Level 2 (Red)    Shoulder Extension  Strengthening;Both;10 reps;Theraband    Theraband Level (Shoulder Extension)  Level 2 (Red)    Other Standing Lumbar Exercises  anti rotation side steps x 10 Rt/Lt       Lumbar Exercises: Prone   Opposite Arm/Leg Raise  Right arm/Left leg;Left arm/Right leg;5 reps   2 sets            PT Education - 09/06/19 1013    Education Details  HEP; issued biofreeze    Person(s)  Educated  Patient    Methods  Explanation;Handout;Verbal cues;Demonstration;Tactile cues    Comprehension  Verbalized understanding;Returned demonstration          PT Long Term Goals - 09/06/19 0954      PT LONG TERM GOAL #1   Title  independent with HEP    Status  On-going      PT LONG TERM GOAL #2   Title  FOTO score improved to </= 21% limited    Status  On-going      PT LONG TERM GOAL #3   Title  report ability to tolerate full work day with pain < 3/10    Status  On-going      PT LONG TERM GOAL #4   Title  report ability to walk at least 30-45 min without increase in pain    Status  Achieved      PT LONG TERM GOAL #5   Title  n/a            Plan - 09/06/19 0954    Clinical Impression Statement  Pt reported ability to walk 45 min without any pain; has met #4. Pt required minor cues for posture throughout session.  Able to tolerate new exercises well without increase in back pain.    Personal Factors and Comorbidities  Comorbidity 3+    Comorbidities  HTN, spinal  stenosis, obesity    Examination-Activity Limitations  Locomotion Level;Carry;Squat;Lift    Examination-Participation Restrictions  Other   work   Stability/Clinical Decision Making  Stable/Uncomplicated    Rehab Potential  Good    PT Frequency  2x / week    PT Duration  6 weeks    PT Treatment/Interventions  ADLs/Self Care Home Management;Cryotherapy;Electrical Stimulation;Ultrasound;Traction;Moist Heat;Therapeutic activities;Therapeutic exercise;Patient/family education;Manual techniques;Taping;Dry needling    PT Home Exercise Plan  Access Code: [email or web address]    Consulted and Agree with Plan of Care  Patient       Patient will benefit from skilled therapeutic intervention in order to improve the following deficits and impairments:  Decreased range of motion, Increased fascial restricitons, Increased muscle spasms, Obesity, Pain, Postural dysfunction, Impaired flexibility  Visit Diagnosis: Acute right-sided low back pain with right-sided sciatica  Abnormal posture     Problem List Patient Active Problem List   Diagnosis Date Noted  . Lumbar spinal stenosis 08/14/2019  . Rosacea 08/04/2017  . History of cholecystectomy 02/05/2016  . Subclinical hypothyroidism 09/08/2015  . Prediabetes 09/08/2015  . Obesity 09/08/2015  . Right thyroid nodule 09/07/2015  . Hyperlipidemia 10/17/2014  . Bilateral knee pain 06/07/2013  . Annual physical exam 05/09/2013  . Essential hypertension, benign 05/09/2013    Alan Gardner, PTA 09/06/19 10:15 AM  [place], [place], [address] Phone: [phone]   Fax:  [phone]  Name: Alan Gardner MRN: [id number] Date of Birth: 1971-08-07     PHYSICAL THERAPY DISCHARGE SUMMARY  Visits from Start of Care: 4  Current functional level related to goals / functional outcomes: See above   Remaining deficits: Unknown, pt canceled remaining appts and did not  reschedule   Education / Equipment: HEP  Plan: Patient agrees to discharge.  Patient goals were partially met. Patient is being discharged due to not returning since the last visit.  ?????    Laureen Abrahams, PT, DPT 10/28/19 9:24 AM  [facility] [place] [facility] [place], [place] [address]  [phone] (office) [phone] (fax)

## 2019-09-11 ENCOUNTER — Encounter: Payer: 59 | Admitting: Physical Therapy

## 2019-09-13 ENCOUNTER — Encounter: Payer: 59 | Admitting: Physical Therapy

## 2019-09-26 ENCOUNTER — Ambulatory Visit: Payer: 59 | Admitting: Sports Medicine

## 2019-11-12 ENCOUNTER — Other Ambulatory Visit: Payer: Self-pay | Admitting: *Deleted

## 2019-11-12 DIAGNOSIS — E039 Hypothyroidism, unspecified: Secondary | ICD-10-CM

## 2019-11-12 DIAGNOSIS — E038 Other specified hypothyroidism: Secondary | ICD-10-CM

## 2019-11-12 DIAGNOSIS — I10 Essential (primary) hypertension: Secondary | ICD-10-CM

## 2019-11-12 MED ORDER — LEVOTHYROXINE SODIUM 50 MCG PO TABS: 50.0000 ug | ORAL_TABLET | Freq: Every day | ORAL | 0 refills | Status: DC

## 2019-11-12 MED ORDER — LISINOPRIL-HYDROCHLOROTHIAZIDE 20-12.5 MG PO TABS: ORAL_TABLET | ORAL | 4 refills | Status: DC

## 2019-11-14 ENCOUNTER — Other Ambulatory Visit: Payer: Self-pay | Admitting: Sports Medicine

## 2019-11-14 DIAGNOSIS — E038 Other specified hypothyroidism: Secondary | ICD-10-CM

## 2019-11-14 DIAGNOSIS — E039 Hypothyroidism, unspecified: Secondary | ICD-10-CM

## 2019-11-14 MED ORDER — LEVOTHYROXINE SODIUM 50 MCG PO TABS: 50.0000 ug | ORAL_TABLET | Freq: Every day | ORAL | 0 refills | Status: DC

## 2020-05-11 ENCOUNTER — Other Ambulatory Visit: Payer: Self-pay | Admitting: Sports Medicine

## 2020-05-11 DIAGNOSIS — E038 Other specified hypothyroidism: Secondary | ICD-10-CM

## 2020-05-11 MED ORDER — LEVOTHYROXINE SODIUM 50 MCG PO TABS: 50.0000 ug | ORAL_TABLET | Freq: Every day | ORAL | 3 refills | Status: DC

## 2020-07-10 ENCOUNTER — Other Ambulatory Visit: Payer: Self-pay | Admitting: Sports Medicine

## 2020-07-10 DIAGNOSIS — I10 Essential (primary) hypertension: Secondary | ICD-10-CM

## 2020-08-09 ENCOUNTER — Other Ambulatory Visit: Payer: Self-pay | Admitting: Sports Medicine

## 2020-08-09 DIAGNOSIS — E7800 Pure hypercholesterolemia, unspecified: Secondary | ICD-10-CM

## 2020-08-25 ENCOUNTER — Other Ambulatory Visit: Payer: Self-pay | Admitting: Sports Medicine

## 2020-08-25 DIAGNOSIS — L719 Rosacea, unspecified: Secondary | ICD-10-CM

## 2021-01-05 ENCOUNTER — Other Ambulatory Visit: Payer: Self-pay

## 2021-01-05 DIAGNOSIS — L719 Rosacea, unspecified: Secondary | ICD-10-CM

## 2021-02-02 ENCOUNTER — Emergency Department (INDEPENDENT_AMBULATORY_CARE_PROVIDER_SITE_OTHER)
Admission: RE | Admit: 2021-02-02 | Discharge: 2021-02-02 | Disposition: A | Discharge status: Home / Self Care | Payer: 59 | Source: Ambulatory Visit | Attending: Family Medicine | Admitting: Family Medicine

## 2021-02-02 ENCOUNTER — Ambulatory Visit: Payer: 59 | Admitting: Family Medicine

## 2021-02-02 ENCOUNTER — Other Ambulatory Visit: Payer: Self-pay

## 2021-02-02 VITALS — BP 90/60 | HR 52 | Temp 98.7°F | Resp 18 | Ht 73.0 in | Wt 275.0 lb

## 2021-02-02 DIAGNOSIS — Z87442 Personal history of urinary calculi: Secondary | ICD-10-CM | POA: Diagnosis not present

## 2021-02-02 DIAGNOSIS — R319 Hematuria, unspecified: Secondary | ICD-10-CM

## 2021-02-02 DIAGNOSIS — N200 Calculus of kidney: Secondary | ICD-10-CM

## 2021-02-02 DIAGNOSIS — R109 Unspecified abdominal pain: Secondary | ICD-10-CM

## 2021-02-02 LAB — POCT URINALYSIS DIP (MANUAL ENTRY)
Bilirubin, UA: NEGATIVE
Glucose, UA: 250 mg/dL — AB
Leukocytes, UA: NEGATIVE
Nitrite, UA: NEGATIVE
Protein Ur, POC: 30 mg/dL — AB
Spec Grav, UA: >=1.03 — AB (ref 1.010–1.025)
Urobilinogen, UA: 1 E.U./dL
pH, UA: 5.5 (ref 5.0–8.0)

## 2021-02-02 MED ORDER — ONDANSETRON HCL 4 MG/2ML IJ SOLN
4.0000 mg | Freq: Once | INTRAMUSCULAR | Status: AC
  Administered 2021-02-02: 4 mg via INTRAMUSCULAR

## 2021-02-02 MED ORDER — TAMSULOSIN HCL 0.4 MG PO CAPS: 0.4000 mg | ORAL_CAPSULE | Freq: Every day | ORAL | 0 refills | Status: DC

## 2021-02-02 MED ORDER — KETOROLAC TROMETHAMINE 60 MG/2ML IM SOLN
60.0000 mg | Freq: Once | INTRAMUSCULAR | Status: AC
  Administered 2021-02-02: 60 mg via INTRAMUSCULAR

## 2021-02-02 MED ORDER — OXYCODONE-ACETAMINOPHEN 7.5-325 MG PO TABS: 1.0000 | ORAL_TABLET | ORAL | 0 refills | Status: DC | PRN

## 2021-02-02 MED ORDER — ONDANSETRON HCL 8 MG PO TABS: 8.0000 mg | ORAL_TABLET | Freq: Three times a day (TID) | ORAL | 0 refills | Status: DC | PRN

## 2021-02-02 NOTE — ED Provider Notes (Signed)
[facility]    CSN: [id number] Arrival date & time: 02/02/21  1151      History   Chief Complaint Chief Complaint  Patient presents with  . Nephrolithiasis    HPI Alan Gardner is a 50 y.o. male.   HPI  50 year old gentleman states that he has had a history of multiple kidney stones in the past.  This time his kidney stone is been bothering him since yesterday.  He has severe pain in his right flank that radiates down to his right testicle.  Nausea.  No vomiting.  No fever or chills.  He has not seen any hematuria. He does not have a urologist that he follows.  He states he usually just goes the emergency room from pain medicine and passes them at home.  Last imaging in our records is from 2011   Past Medical History:  Diagnosis Date  . Hyperlipemia   . Hypertension     Patient Active Problem List   Diagnosis Date Noted  . Lumbar spinal stenosis 08/14/2019  . Rosacea 08/04/2017  . History of cholecystectomy 02/05/2016  . Subclinical hypothyroidism 09/08/2015  . Prediabetes 09/08/2015  . Obesity 09/08/2015  . Right thyroid nodule 09/07/2015  . Hyperlipidemia 10/17/2014  . Bilateral knee pain 06/07/2013  . Annual physical exam 05/09/2013  . Essential hypertension, benign 05/09/2013    Past Surgical History:  Procedure Laterality Date  . KIDNEY STONE SURGERY         Home Medications    Prior to Admission medications   Medication Sig Start Date End Date Taking? Authorizing Provider  ondansetron (ZOFRAN) 8 MG tablet Take 1 tablet (8 mg total) by mouth every 8 (eight) hours as needed for nausea or vomiting. 02/02/21  Yes Eustace Moore, MD  oxyCODONE-acetaminophen (PERCOCET) 7.5-325 MG tablet Take 1 tablet by mouth every 4 (four) hours as needed for up to 3 days for severe pain. 02/02/21 02/05/21 Yes Eustace Moore, MD  tamsulosin (FLOMAX) 0.4 MG CAPS capsule Take 1 capsule (0.4 mg total) by mouth daily after supper. 02/02/21  Yes Eustace Moore, MD   atorvastatin (LIPITOR) 10 MG tablet TAKE 1 TABLET(10 MG) BY MOUTH DAILY 08/10/20   Monica Becton, MD  levothyroxine (SYNTHROID) 50 MCG tablet Take 1 tablet (50 mcg total) by mouth daily before breakfast. 05/11/20   Monica Becton, MD  lisinopril-hydrochlorothiazide (ZESTORETIC) 20-12.5 MG tablet TAKE 1 TABLET BY MOUTH DAILY. FOLLOW UP VISIT 11/12/19   Monica Becton, MD    Family History Family History  Problem Relation Age of Onset  . Hypertension Mother   . Diabetes Father     Social History Social History   Tobacco Use  . Smoking status: Never Smoker  . Smokeless tobacco: Never Used  Vaping Use  . Vaping Use: Never used  Substance Use Topics  . Alcohol use: Yes  . Drug use: No     Allergies   Patient has no known allergies.   Review of Systems Review of Systems See HPI  Physical Exam Triage Vital Signs ED Triage Vitals  Enc Vitals Group     BP 02/02/21 1219 90/60     Pulse Rate 02/02/21 1219 (!) 52     Resp 02/02/21 1219 18     Temp 02/02/21 1219 98.7 F (37.1 C)     Temp Source 02/02/21 1219 Oral     SpO2 02/02/21 1219 97 %     Weight 02/02/21 1220 275 lb (124.7 kg)  Height 02/02/21 1220 6\' 1"  (1.854 m)     Head Circumference --      Peak Flow --      Pain Score 02/02/21 1220 7     Pain Loc --      Pain Edu? --      Excl. in GC? --    No data found.  Updated Vital Signs BP 90/60 (BP Location: Left Arm)   Pulse (!) 52   Temp 98.7 F (37.1 C) (Oral)   Resp 18   Ht 6\' 1"  (1.854 m)   Wt 124.7 kg   SpO2 97%   BMI 36.28 kg/m     Physical Exam Constitutional:      General: He is in acute distress.     Appearance: He is well-developed and well-nourished.     Comments: Patient is acutely uncomfortable.  Stiff and guarded movements.  HENT:     Head: Normocephalic and atraumatic.     Mouth/Throat:     Mouth: Oropharynx is clear and moist.     Comments: Mask is in place.  Mouth slightly dry Eyes:      Conjunctiva/sclera: Conjunctivae normal.     Pupils: Pupils are equal, round, and reactive to light.  Cardiovascular:     Rate and Rhythm: Normal rate and regular rhythm.     Heart sounds: Normal heart sounds.  Pulmonary:     Effort: Pulmonary effort is normal. No respiratory distress.     Breath sounds: Normal breath sounds.  Abdominal:     General: There is no distension.     Palpations: Abdomen is soft.     Tenderness: There is abdominal tenderness. There is right CVA tenderness.  Musculoskeletal:        General: No edema. Normal range of motion.     Cervical back: Normal range of motion.  Skin:    General: Skin is warm and dry.  Neurological:     Mental Status: He is alert.  Psychiatric:        Behavior: Behavior normal.      UC Treatments / Results  Labs (all labs ordered are listed, but only abnormal results are displayed) Labs Reviewed  POCT URINALYSIS DIP (MANUAL ENTRY) - Abnormal; Notable for the following components:      Result Value   Color, UA brown (*)    Clarity, UA cloudy (*)    Glucose, UA =250 (*)    Ketones, POC UA trace (5) (*)    Spec Grav, UA >=1.030 (*)    Blood, UA large (*)    Protein Ur, POC =30 (*)    All other components within normal limits   Blood glucose random 145 EKG   Radiology No results found.  Procedures Procedures (including critical care time)  Medications Ordered in UC Medications  ketorolac (TORADOL) injection 60 mg (60 mg Intramuscular Given 02/02/21 1249)  ondansetron (ZOFRAN) injection 4 mg (4 mg Intramuscular Given 02/02/21 1311)  ondansetron (ZOFRAN) injection 4 mg (4 mg Intramuscular Given 02/02/21 1311)    Initial Impression / Assessment and Plan / UC Course  I have reviewed the triage vital signs and the nursing notes.  Pertinent labs & imaging results that were available during my care of the patient were reviewed by me and considered in my medical decision making (see chart for details).     Patient had  improvement in pain and vomiting with his Toradol and Zofran.  He was able to drink some fluids.  He was  able to give [place] a urine sample.  We discussed conservative treatment. Final Clinical Impressions(s) / UC Diagnoses   Final diagnoses:  History of kidney stones  Hematuria, unspecified type  Flank pain     Discharge Instructions     You need to follow-up on the elevated blood sugar with your primary care doctor  Drink lots of fluids Take pain medicine as needed Take tamsulosin daily to help pass kidney stone I have also prescribed nausea medicine Call your family doctor if not improving in a couple of days, you may need to see urology If you become acutely worse with increasing pain, vomiting, or fever you must go to the emergency room    ED Prescriptions    Medication Sig Dispense Auth. Provider   tamsulosin (FLOMAX) 0.4 MG CAPS capsule Take 1 capsule (0.4 mg total) by mouth daily after supper. 30 capsule Eustace Moore, MD   oxyCODONE-acetaminophen (PERCOCET) 7.5-325 MG tablet Take 1 tablet by mouth every 4 (four) hours as needed for up to 3 days for severe pain. 12 tablet Eustace Moore, MD   ondansetron (ZOFRAN) 8 MG tablet Take 1 tablet (8 mg total) by mouth every 8 (eight) hours as needed for nausea or vomiting. 20 tablet Eustace Moore, MD     I have reviewed the PDMP during this encounter.   Eustace Moore, MD 02/02/21 [phone]

## 2021-02-02 NOTE — Discharge Instructions (Signed)
You need to follow-up on the elevated blood sugar with your primary care doctor  Drink lots of fluids Take pain medicine as needed Take tamsulosin daily to help pass kidney stone I have also prescribed nausea medicine Call your family doctor if not improving in a couple of days, you may need to see urology If you become acutely worse with increasing pain, vomiting, or fever you must go to the emergency room

## 2021-02-02 NOTE — ED Triage Notes (Signed)
Rt side kidney stone started this morning about 6:30, pain in lower abdominal pain and RT testicle.

## 2021-02-04 ENCOUNTER — Encounter: Payer: Self-pay | Admitting: Family Medicine

## 2021-02-04 ENCOUNTER — Ambulatory Visit (INDEPENDENT_AMBULATORY_CARE_PROVIDER_SITE_OTHER): Payer: 59

## 2021-02-04 ENCOUNTER — Ambulatory Visit (INDEPENDENT_AMBULATORY_CARE_PROVIDER_SITE_OTHER): Payer: 59 | Admitting: Family Medicine

## 2021-02-04 ENCOUNTER — Other Ambulatory Visit: Payer: Self-pay

## 2021-02-04 VITALS — BP 120/82 | HR 85 | Ht 73.0 in | Wt 284.0 lb

## 2021-02-04 DIAGNOSIS — M545 Low back pain, unspecified: Secondary | ICD-10-CM

## 2021-02-04 DIAGNOSIS — N200 Calculus of kidney: Secondary | ICD-10-CM

## 2021-02-04 DIAGNOSIS — R109 Unspecified abdominal pain: Secondary | ICD-10-CM | POA: Diagnosis not present

## 2021-02-04 LAB — POCT URINALYSIS DIP (CLINITEK)
Bilirubin, UA: NEGATIVE
Blood, UA: NEGATIVE
Glucose, UA: NEGATIVE mg/dL
Ketones, POC UA: NEGATIVE mg/dL
Leukocytes, UA: NEGATIVE
Nitrite, UA: NEGATIVE
POC PROTEIN,UA: NEGATIVE
Spec Grav, UA: 1.015 (ref 1.010–1.025)
Urobilinogen, UA: 0.2 E.U./dL
pH, UA: 6 (ref 5.0–8.0)

## 2021-02-04 NOTE — Progress Notes (Signed)
Acute Office Visit  Subjective:    Patient ID: Alan Gardner, male    DOB: January 16, 1971, 50 y.o.   MRN: [id number]  Chief Complaint  Patient presents with  . Back Pain    HPI Patient is in today for back pain.  He was actually seen in urgent care 2 days ago he had severe right flank pain that was radiating into his right testicle he was also nauseated but no vomiting.  History of kidney stones.  He was given Flomax, Zofran, and some oxycodone in urgent care.  He says today he feels worse than yesterday but says the worse day was 2 days ago.  Last stone was in June 2021.  Has been problematic for well over a decade.  Past Medical History:  Diagnosis Date  . Hyperlipemia   . Hypertension     Past Surgical History:  Procedure Laterality Date  . KIDNEY STONE SURGERY      Family History  Problem Relation Age of Onset  . Hypertension Mother   . Diabetes Father     Social History   Socioeconomic History  . Marital status: Married    Spouse name: Not on file  . Number of children: Not on file  . Years of education: Not on file  . Highest education level: Not on file  Occupational History  . Not on file  Tobacco Use  . Smoking status: Never Smoker  . Smokeless tobacco: Never Used  Vaping Use  . Vaping Use: Never used  Substance and Sexual Activity  . Alcohol use: Yes  . Drug use: No  . Sexual activity: Not on file  Other Topics Concern  . Not on file  Social History Narrative  . Not on file   Social Determinants of Health   Financial Resource Strain: Not on file  Food Insecurity: Not on file  Transportation Needs: Not on file  Physical Activity: Not on file  Stress: Not on file  Social Connections: Not on file  Intimate Partner Violence: Not on file    Outpatient Medications Prior to Visit  Medication Sig Dispense Refill  . atorvastatin (LIPITOR) 10 MG tablet TAKE 1 TABLET(10 MG) BY MOUTH DAILY 90 tablet 3  . levothyroxine (SYNTHROID) 50 MCG tablet Take 1  tablet (50 mcg total) by mouth daily before breakfast. 90 tablet 3  . lisinopril-hydrochlorothiazide (ZESTORETIC) 20-12.5 MG tablet TAKE 1 TABLET BY MOUTH DAILY. FOLLOW UP VISIT 30 tablet 4  . ondansetron (ZOFRAN) 8 MG tablet Take 1 tablet (8 mg total) by mouth every 8 (eight) hours as needed for nausea or vomiting. 20 tablet 0  . oxyCODONE-acetaminophen (PERCOCET) 7.5-325 MG tablet Take 1 tablet by mouth every 4 (four) hours as needed for up to 3 days for severe pain. 12 tablet 0  . tamsulosin (FLOMAX) 0.4 MG CAPS capsule Take 1 capsule (0.4 mg total) by mouth daily after supper. 30 capsule 0   No facility-administered medications prior to visit.    No Known Allergies  Review of Systems     Objective:    Physical Exam Vitals reviewed.  Constitutional:      Appearance: He is well-developed.  HENT:     Head: Normocephalic and atraumatic.  Eyes:     Conjunctiva/sclera: Conjunctivae normal.  Cardiovascular:     Rate and Rhythm: Normal rate.  Pulmonary:     Effort: Pulmonary effort is normal.  Skin:    General: Skin is dry.     Coloration: Skin is not pale.  Neurological:     Mental Status: He is alert and oriented to person, place, and time.  Psychiatric:        Behavior: Behavior normal.     BP 120/82   Pulse 85   Ht 6\' 1"  (1.854 m)   Wt 284 lb (128.8 kg)   SpO2 98%   BMI 37.47 kg/m  Wt Readings from Last 3 Encounters:  02/04/21 284 lb (128.8 kg)  02/02/21 275 lb (124.7 kg)  08/14/19 276 lb (125.2 kg)    There are no preventive care reminders to display for this patient.  There are no preventive care reminders to display for this patient.   Lab Results  Component Value Date   TSH 5.44 (H) 08/14/2019   Lab Results  Component Value Date   WBC 11.4 (H) 08/14/2019   HGB 15.5 08/14/2019   HCT 45.0 08/14/2019   MCV 84.6 08/14/2019   PLT 329 08/14/2019   Lab Results  Component Value Date   NA 141 08/14/2019   K 4.8 08/14/2019   CO2 23 08/14/2019    GLUCOSE 84 08/14/2019   BUN 18 08/14/2019   CREATININE 0.93 08/14/2019   BILITOT 0.7 08/14/2019   ALKPHOS 88 02/01/2016   AST 17 08/14/2019   ALT 21 08/14/2019   PROT 7.4 08/14/2019   ALBUMIN 3.6 02/01/2016   CALCIUM 9.5 08/14/2019   Lab Results  Component Value Date   CHOL 210 (H) 08/14/2019   Lab Results  Component Value Date   HDL 38 (L) 08/14/2019   Lab Results  Component Value Date   LDLCALC 145 (H) 08/14/2019   Lab Results  Component Value Date   TRIG 138 08/14/2019   Lab Results  Component Value Date   CHOLHDL 5.5 (H) 08/14/2019   Lab Results  Component Value Date   HGBA1C 5.3 08/14/2019       Assessment & Plan:   Problem List Items Addressed This Visit   None   Visit Diagnoses    Right low back pain, unspecified chronicity, unspecified whether sciatica present    -  Primary   Relevant Orders   POCT URINALYSIS DIP (CLINITEK) (Completed)   Ambulatory referral to Urology   CT RENAL STONE STUDY (Completed)   Abdominal pain, unspecified abdominal location       Relevant Orders   Ambulatory referral to Urology   CT RENAL STONE STUDY (Completed)   Kidney stone       Relevant Orders   Ambulatory referral to Urology   CT RENAL STONE STUDY (Completed)     Right flank pain - will schedule CT stone study and refer him to Urology.  We will call with results. continue with Flomax and PRN nausea medication.    No orders of the defined types were placed in this encounter.    08/16/2019, MD

## 2021-02-04 NOTE — Progress Notes (Signed)
Pt was seen at [facility] om 02/02/2021 he was Dx with Nephrolithiasis. He has a hx of kidney stones.   He doesn't have a urologist, no preference in location.

## 2021-02-05 ENCOUNTER — Other Ambulatory Visit: Payer: Self-pay

## 2021-02-05 MED ORDER — OXYCODONE-ACETAMINOPHEN 7.5-325 MG PO TABS: 1.0000 | ORAL_TABLET | ORAL | 0 refills | Status: AC | PRN

## 2021-05-07 ENCOUNTER — Other Ambulatory Visit: Payer: Self-pay | Admitting: Sports Medicine

## 2021-05-07 DIAGNOSIS — E038 Other specified hypothyroidism: Secondary | ICD-10-CM

## 2021-05-07 MED ORDER — LEVOTHYROXINE SODIUM 50 MCG PO TABS
50.0000 ug | ORAL_TABLET | Freq: Every day | ORAL | 3 refills | Status: DC
Start: 2021-05-07 — End: 2022-05-16

## 2021-10-28 ENCOUNTER — Encounter: Payer: Self-pay | Admitting: Family Medicine

## 2021-10-28 ENCOUNTER — Other Ambulatory Visit: Payer: Self-pay

## 2021-10-28 ENCOUNTER — Telehealth (INDEPENDENT_AMBULATORY_CARE_PROVIDER_SITE_OTHER): Payer: 59 | Admitting: Family Medicine

## 2021-10-28 VITALS — BP 169/109

## 2021-10-28 DIAGNOSIS — U071 COVID-19: Secondary | ICD-10-CM

## 2021-10-28 MED ORDER — PREDNISONE 20 MG PO TABS: 40.0000 mg | ORAL_TABLET | Freq: Every day | ORAL | 0 refills | Status: DC

## 2021-10-28 MED ORDER — FLUTICASONE PROPIONATE 50 MCG/ACT NA SUSP: 2.0000 | Freq: Every day | NASAL | 2 refills | Status: DC

## 2021-10-28 MED ORDER — GUAIFENESIN ER 600 MG PO TB12: 1200.0000 mg | ORAL_TABLET | Freq: Two times a day (BID) | ORAL | 2 refills | Status: DC

## 2021-10-28 MED ORDER — PREDNISONE 20 MG PO TABS: 40.0000 mg | ORAL_TABLET | Freq: Every day | ORAL | 0 refills | Status: AC

## 2021-10-28 MED ORDER — PHENOL 1.4 % MT LIQD
1.0000 | OROMUCOSAL | 1 refills | Status: DC | PRN
Start: 2021-10-28 — End: 2023-02-06

## 2021-10-28 MED ORDER — PHENOL 1.4 % MT LIQD: 1.0000 | OROMUCOSAL | 1 refills | Status: DC | PRN

## 2021-10-28 NOTE — Patient Instructions (Signed)

## 2021-10-28 NOTE — Progress Notes (Signed)
Virtual Video Visit via MyChart Note  I connected with  Alan Gardner on 10/28/21 at  4:00 PM EST by the video enabled telemedicine application for MyChart, and verified that I am speaking with the correct person using two identifiers.   I introduced myself as a Publishing rights manager with the practice. We discussed the limitations of evaluation and management by telemedicine and the availability of in person appointments. The patient expressed understanding and agreed to proceed.  Participating parties in this visit include: The patient, spouse,  and the nurse practitioner listed.  The patient is: At home I am: In the office - Primary [facility]  Subjective:    CC:  Chief Complaint  Patient presents with   Covid Positive    HPI: Alan Gardner is a 50 y.o. year old male presenting today via MyChart today for COVID+.  Patient reports about 6 days ago he came down with fever, chills, body aches, sinus pain, sore throat.  Symptoms progressed and on Tuesday he tested positive for COVID.  He continues to have previously stated symptoms along with some new left-sided ear discomfort rated 3 out of 10 on pain scale.  He does not have his taste or smell.  He denies any nausea, vomiting, diarrhea, chest pain, dyspnea, wheezing, recurrent cough.  He has been taking DayQuil, NyQuil, TheraFlu, ibuprofen alternating as needed.  His wife is also sick and tested positive Tuesday as well.  He was around his father a few days prior to showing symptoms and father was feeling poorly at that time.  He has been vaccinated against COVID.     Past medical history, Surgical history, Family history not pertinant except as noted below, Social history, Allergies, and medications have been entered into the medical record, reviewed, and corrections made.   Review of Systems:  All review of systems negative except what is listed in the HPI   Objective:    General:  Speaking clearly in complete  sentences. Absent shortness of breath noted.   Alert and oriented x3.   Normal judgment.  Absent acute distress.     Assessment & Plan:   1. COVID-19 Patient is technically out of the window for antiviral therapy given has been greater than 5 days of symptoms.  Patient is understanding and prefers to start with prednisone burst and supportive management anyway.  Adding Flonase, Mucinex, Chloraseptic spray.  Can continue over-the-counter cough/cold/analgesics, be careful to read labels and not double up on medications.  Blood pressure was elevated per patient report.  Recommend a recheck this evening after resting for a little while.  If remains elevated please let us know. Patient aware of signs/symptoms requiring further/urgent evaluation.  If not feeling better after the weekend can be seen in office to further evaluate.  - fluticasone (FLONASE) 50 MCG/ACT nasal spray; Place 2 sprays into both nostrils daily.  Dispense: 1 g; Refill: 2 - guaiFENesin (MUCINEX) 600 MG 12 hr tablet; Take 2 tablets (1,200 mg total) by mouth 2 (two) times daily.  Dispense: 30 tablet; Refill: 2 - phenol (CHLORASEPTIC) 1.4 % LIQD; Use as directed 1 spray in the mouth or throat as needed for throat irritation / pain.  Dispense: 177 mL; Refill: 1 - predniSONE (DELTASONE) 20 MG tablet; Take 2 tablets (40 mg total) by mouth daily with breakfast for 5 days.  Dispense: 10 tablet; Refill: 0     Follow-up if symptoms worsen or fail to improve.    I discussed the assessment and treatment plan with  the patient. The patient was provided an opportunity to ask questions and all were answered. The patient agreed with the plan and demonstrated an understanding of the instructions.   The patient was advised to call back or seek an in-person evaluation if the symptoms worsen or if the condition fails to improve as anticipated.  I spent 20 minutes dedicated to the care of this patient on the date of this encounter to include  pre-visit chart review of prior notes and results, face-to-face time with the patient, and post-visit ordering of testing as indicated.   Clayborne Dana, NP

## 2021-12-22 ENCOUNTER — Emergency Department (INDEPENDENT_AMBULATORY_CARE_PROVIDER_SITE_OTHER)
Admission: EM | Admit: 2021-12-22 | Discharge: 2021-12-22 | Disposition: A | Discharge status: Home / Self Care | Payer: 59 | Source: Home / Self Care | Attending: Family Medicine | Admitting: Family Medicine

## 2021-12-22 ENCOUNTER — Other Ambulatory Visit: Payer: Self-pay

## 2021-12-22 ENCOUNTER — Encounter: Payer: Self-pay | Admitting: Emergency Medicine

## 2021-12-22 DIAGNOSIS — M5416 Radiculopathy, lumbar region: Secondary | ICD-10-CM | POA: Diagnosis not present

## 2021-12-22 MED ORDER — PREDNISONE 20 MG PO TABS
ORAL_TABLET | ORAL | 0 refills | Status: DC
Start: 2021-12-22 — End: 2023-02-06

## 2021-12-22 NOTE — ED Triage Notes (Signed)
Pt states about 1 hour ago he bent over to feed his cat and felt a sharp pain in his back. Pain radiates down right leg.

## 2021-12-22 NOTE — Discharge Instructions (Signed)
Apply ice pack to lower back midline for 20 to 30 minutes, 3 to 4 times daily  Continue until pain decreases.  May take two Extra-strength Tylenol tabs at bedtime for pain.

## 2021-12-22 NOTE — ED Provider Notes (Signed)
[facility]    CSN: [id number] Arrival date & time: 12/22/21  1907      History   Chief Complaint Chief Complaint  Patient presents with   Back Pain    HPI Atticus Xayavong is a 51 y.o. male.   About an hour ago patient bent over to feed his cat and felt a sharp pain in his lower back, primarily on his right.  The pain now radiates to his right anterior thigh to his knee.   He denies bowel or bladder dysfunction, and no saddle numbness.    The history is provided by the patient.  Back Pain Location:  Lumbar spine Quality:  Stabbing Radiates to:  R thigh Pain severity:  Moderate Pain is:  Same all the time Onset quality:  Sudden Duration:  1 hour Timing:  Constant Progression:  Unchanged Chronicity:  New Context comment:  Bending over Relieved by:  Nothing Worsened by:  Ambulation and bending Ineffective treatments:  None tried Associated symptoms: no abdominal pain, no abdominal swelling, no bladder incontinence, no bowel incontinence, no dysuria, no fever, no leg pain, no numbness, no paresthesias, no perianal numbness, no tingling, no weakness and no weight loss    Past Medical History:  Diagnosis Date   Hyperlipemia    Hypertension     Patient Active Problem List   Diagnosis Date Noted   Lumbar spinal stenosis 08/14/2019   Rosacea 08/04/2017   History of cholecystectomy 02/05/2016   Subclinical hypothyroidism 09/08/2015   Prediabetes 09/08/2015   Obesity 09/08/2015   Right thyroid nodule 09/07/2015   Hyperlipidemia 10/17/2014   Bilateral knee pain 06/07/2013   Annual physical exam 05/09/2013   Essential hypertension, benign 05/09/2013    Past Surgical History:  Procedure Laterality Date   KIDNEY STONE SURGERY         Home Medications    Prior to Admission medications   Medication Sig Start Date End Date Taking? Authorizing Provider  predniSONE (DELTASONE) 20 MG tablet Take one tab by mouth twice daily for 4 days, then one daily.  Take with food. 12/22/21  Yes Kandra Nicolas, MD  atorvastatin (LIPITOR) 10 MG tablet TAKE 1 TABLET(10 MG) BY MOUTH DAILY 08/10/20   Silverio Decamp, MD  doxycycline (VIBRAMYCIN) 100 MG capsule Take 100 mg by mouth daily. 08/21/21   [provider]  fluticasone (FLONASE) 50 MCG/ACT nasal spray Place 2 sprays into both nostrils daily. 10/28/21 10/28/22  Terrilyn Saver, NP  guaiFENesin (MUCINEX) 600 MG 12 hr tablet Take 2 tablets (1,200 mg total) by mouth 2 (two) times daily. 10/28/21   Terrilyn Saver, NP  levothyroxine (SYNTHROID) 50 MCG tablet Take 1 tablet (50 mcg total) by mouth daily before breakfast. 05/07/21   Silverio Decamp, MD  lisinopril-hydrochlorothiazide (ZESTORETIC) 20-12.5 MG tablet TAKE 1 TABLET BY MOUTH DAILY. FOLLOW UP VISIT 11/12/19   Silverio Decamp, MD  ondansetron (ZOFRAN) 8 MG tablet Take 1 tablet (8 mg total) by mouth every 8 (eight) hours as needed for nausea or vomiting. 02/02/21   Raylene Everts, MD  phenol (CHLORASEPTIC) 1.4 % LIQD Use as directed 1 spray in the mouth or throat as needed for throat irritation / pain. 10/28/21   Terrilyn Saver, NP  tamsulosin (FLOMAX) 0.4 MG CAPS capsule Take 1 capsule (0.4 mg total) by mouth daily after supper. 02/02/21   Raylene Everts, MD    Family History Family History  Problem Relation Age of Onset   Hypertension Mother  Diabetes Father     Social History Social History   Tobacco Use   Smoking status: Never   Smokeless tobacco: Never  Vaping Use   Vaping Use: Never used  Substance Use Topics   Alcohol use: Yes   Drug use: No     Allergies   Patient has no known allergies.   Review of Systems Review of Systems  Constitutional:  Positive for activity change. Negative for chills, diaphoresis, fatigue, fever, unexpected weight change and weight loss.  Gastrointestinal:  Negative for abdominal pain and bowel incontinence.  Genitourinary:  Negative for bladder incontinence and dysuria.   Musculoskeletal:  Positive for back pain.  Skin:  Negative for rash.  Neurological:  Negative for tingling, weakness, numbness and paresthesias.  All other systems reviewed and are negative.   Physical Exam Triage Vital Signs ED Triage Vitals  Enc Vitals Group     BP 12/22/21 1920 (!) 137/92     Pulse Rate 12/22/21 1920 82     Resp 12/22/21 1920 18     Temp 12/22/21 1920 98.5 F (36.9 C)     Temp Source 12/22/21 1920 Oral     SpO2 12/22/21 1920 98 %     Weight 12/22/21 1922 264 lb (119.7 kg)     Height --      Head Circumference --      Peak Flow --      Pain Score 12/22/21 1922 7     Pain Loc --      Pain Edu? --      Excl. in [place]? --    No data found.  Updated Vital Signs BP (!) 137/92 (BP Location: Right Arm)    Pulse 82    Temp 98.5 F (36.9 C) (Oral)    Resp 18    Wt 119.7 kg    SpO2 98%    BMI 34.83 kg/m   Visual Acuity Right Eye Distance:   Left Eye Distance:   Bilateral Distance:    Right Eye Near:   Left Eye Near:    Bilateral Near:     Physical Exam Vitals and nursing note reviewed.  Constitutional:      General: He is not in acute distress. HENT:     Head: Normocephalic.     Mouth/Throat:     Pharynx: Oropharynx is clear.  Eyes:     Conjunctiva/sclera: Conjunctivae normal.     Pupils: Pupils are equal, round, and reactive to light.  Cardiovascular:     Rate and Rhythm: Normal rate and regular rhythm.     Heart sounds: Normal heart sounds.  Pulmonary:     Breath sounds: Normal breath sounds.  Abdominal:     Palpations: Abdomen is soft.     Tenderness: There is no abdominal tenderness.  Musculoskeletal:        General: No tenderness.     Cervical back: Normal range of motion.     Lumbar back: Signs of trauma present. No spasms, tenderness or bony tenderness. Normal range of motion. Positive right straight leg raise test and positive left straight leg raise test. No scoliosis.     Comments: Back:  Range of motion relatively well preserved.   Can heel/toe walk and squat without difficulty. Straight leg raising test is positive on the left at about 15 degrees, and on the right at about 45 degrees.  Sitting knee extension test is positive on the left at about 45 degrees and on the right at about 20  degrees.  Strength and sensation in the lower extremities is normal.  Patellar and achilles reflexes are normal   Skin:    General: Skin is warm and dry.     Findings: No rash.  Neurological:     General: No focal deficit present.     Mental Status: He is alert.     UC Treatments / Results  Labs (all labs ordered are listed, but only abnormal results are displayed) Labs Reviewed - No data to display  EKG   Radiology No results found.  Procedures Procedures (including critical care time)  Medications Ordered in UC Medications - No data to display  Initial Impression / Assessment and Plan / UC Course  I have reviewed the triage vital signs and the nursing notes.  Pertinent labs & imaging results that were available during my care of the patient  were reviewed by me and considered in my medical decision making (see chart for details).    Review of past records reveals a lumbar spine x-ray done 08/14/19 that showed mild degenerative disc disease at L2-3. Suspect L2-3 radiculopathy.  Begin prednisone burst/taper. Followup with Dr. Aundria Mems ([facility]) if not improving about one week.   Final Clinical Impressions(s) / UC Diagnoses   Final diagnoses:  Lumbar back pain with radiculopathy affecting right lower extremity     Discharge Instructions      Apply ice pack to lower back midline for 20 to 30 minutes, 3 to 4 times daily  Continue until pain decreases.  May take two Extra-strength Tylenol tabs at bedtime for pain.    ED Prescriptions     Medication Sig Dispense Auth. Provider   predniSONE (DELTASONE) 20 MG tablet Take one tab by mouth twice daily for 4 days, then one daily. Take with food. 12 tablet Kandra Nicolas, MD         Kandra Nicolas, MD 12/23/21 [phone]

## 2021-12-28 ENCOUNTER — Ambulatory Visit (INDEPENDENT_AMBULATORY_CARE_PROVIDER_SITE_OTHER): Payer: 59 | Admitting: Sports Medicine

## 2021-12-28 ENCOUNTER — Other Ambulatory Visit: Payer: Self-pay

## 2021-12-28 ENCOUNTER — Ambulatory Visit (INDEPENDENT_AMBULATORY_CARE_PROVIDER_SITE_OTHER): Payer: 59

## 2021-12-28 DIAGNOSIS — M5030 Other cervical disc degeneration, unspecified cervical region: Secondary | ICD-10-CM

## 2021-12-28 DIAGNOSIS — M48061 Spinal stenosis, lumbar region without neurogenic claudication: Secondary | ICD-10-CM | POA: Diagnosis not present

## 2021-12-28 MED ORDER — GABAPENTIN 300 MG PO CAPS: ORAL_CAPSULE | ORAL | 3 refills | Status: DC

## 2021-12-28 NOTE — Assessment & Plan Note (Signed)
This is a pleasant 51 year old male, we have treated him in the past for lumbar spinal stenosis, he is having recurrence of discomfort midline low back with radiation down the right leg anterior thigh, as well as posterior thigh but not past the knee. Back pain is described as both discogenic and facetogenic. He went to urgent care and has been treated with prednisone which is not tremendously effective, adding Neurontin, updated lumbar spine x-rays, formal physical therapy. I filled out FMLA paperwork today. Return to see me in 6 weeks, MRI for interventional planning if no better.

## 2021-12-28 NOTE — Assessment & Plan Note (Signed)
Alan Gardner is also having neck pain with radiation to the left periscapular region. Neurontin as below, formal physical therapy, x-rays. Return to see me in 6 weeks, MR if no better.

## 2021-12-28 NOTE — Progress Notes (Signed)
° ° °  Procedures performed today:    None.  Independent interpretation of notes and tests performed by another provider:   None.  Brief History, Exam, Impression, and Recommendations:    Lumbar spinal stenosis This is a pleasant 51 year old male, we have treated him in the past for lumbar spinal stenosis, he is having recurrence of discomfort midline low back with radiation down the right leg anterior thigh, as well as posterior thigh but not past the knee. Back pain is described as both discogenic and facetogenic. He went to urgent care and has been treated with prednisone which is not tremendously effective, adding Neurontin, updated lumbar spine x-rays, formal physical therapy. I filled out FMLA paperwork today. Return to see me in 6 weeks, MRI for interventional planning if no better.  DDD (degenerative disc disease), cervical Alan Gardner is also having neck pain with radiation to the left periscapular region. Neurontin as below, formal physical therapy, x-rays. Return to see me in 6 weeks, MR if no better.    ___________________________________________ Alan Gardner. Benjamin Stain, M.D., ABFM., CAQSM. Primary Care and Sports Medicine [facility]  Adjunct Instructor of Family Medicine  [employer]

## 2022-01-07 ENCOUNTER — Other Ambulatory Visit: Payer: Self-pay

## 2022-01-07 ENCOUNTER — Ambulatory Visit: Payer: 59 | Attending: Sports Medicine | Admitting: Rehabilitative and Restorative Service Providers"

## 2022-01-07 ENCOUNTER — Encounter: Payer: Self-pay | Admitting: Rehabilitative and Restorative Service Providers"

## 2022-01-07 DIAGNOSIS — M48061 Spinal stenosis, lumbar region without neurogenic claudication: Secondary | ICD-10-CM | POA: Diagnosis not present

## 2022-01-07 DIAGNOSIS — M5416 Radiculopathy, lumbar region: Secondary | ICD-10-CM | POA: Diagnosis not present

## 2022-01-07 DIAGNOSIS — R29898 Other symptoms and signs involving the musculoskeletal system: Secondary | ICD-10-CM | POA: Insufficient documentation

## 2022-01-07 DIAGNOSIS — M6281 Muscle weakness (generalized): Secondary | ICD-10-CM | POA: Insufficient documentation

## 2022-01-07 NOTE — Therapy (Signed)
[place], [place], [address] Phone: [phone]   Fax:  [phone]  Physical Therapy Evaluation  Patient Details  Name: Alan Gardner MRN: [id number] Date of Birth: [birth year] Referring Provider (PT): Dr Dianah Field   Encounter Date: 01/07/2022   PT End of Session - 01/07/22 1257     Visit Number 1    Number of Visits 12    Date for PT Re-Evaluation 02/18/22    PT Start Time 1146    PT Stop Time 1236    PT Time Calculation (min) 50 min    Activity Tolerance Patient tolerated treatment well             Past Medical History:  Diagnosis Date   Hyperlipemia    Hypertension     Past Surgical History:  Procedure Laterality Date   KIDNEY STONE SURGERY      There were no vitals filed for this visit.    Subjective Assessment - 01/07/22 1151     Subjective Patient reports that he has been having LBP since 12/22/20 when bending over to get the cat some food. He felt a pop and sharp pain in the LB. He noticed pain in Rt LE later in the day running down the top of thigh to knee. Meds helped some with the pain. He has returned to work with no climbing and no lifting over 20#. Work is going OK. He has some "catching" in the LB and some stiffness after sitting for a period of time. Generally LBP is improving.    Pertinent History prior LBP 2020 and intermittently over the past 10 years; LB strain when in the [employer] 30 years ago; arthritic knees    Patient Stated Goals get rid of pain; to avoid recurrent back pain    Currently in Pain? Yes    Pain Score 2     Pain Location Back    Pain Orientation Right;Lower;Mid    Pain Descriptors / Indicators Dull    Pain Type Acute pain    Pain Radiating Towards radiating anterior Rt thigh to knee on an intermittent basis    Pain Onset 1 to 4 weeks ago    Pain Frequency Intermittent    Aggravating Factors  work; moving the wrong way; prolonged sitting > 30  min hurts sit to stand    Pain Relieving Factors meds; sitting; lying down                [facility] PT Assessment - 01/07/22 0001       Assessment   Medical Diagnosis LBP; Lumbar spinal stenosis    Referring Provider (PT) Dr Dianah Field    Onset Date/Surgical Date 12/22/21    Hand Dominance Right    Next MD Visit 02/11/22    Prior Therapy here for Lt shoulder; Rt LBP 2020      Precautions   Precautions None      Restrictions   Weight Bearing Restrictions No      Balance Screen   Has the patient fallen in the past 6 months No    Has the patient had a decrease in activity level because of a fear of falling?  No    Is the patient reluctant to leave their home because of a fear of falling?  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    Leisure yard work; household chores; was in the gym 8/22      Observation/Other Assessments   Focus on Therapeutic Outcomes (FOTO)  46      Sensation   Additional Comments WFL's per pt report      Posture/Postural Control   Posture Comments head forward; shoulders rounded and elevated ; flexed forward at hips      AROM   Lumbar Flexion 65% pressure Rt LB    Lumbar Extension 55%    Lumbar - Right Side Bend 90% pressure Rt LB    Lumbar - Left Side Bend 80% tight    Lumbar - Right Rotation 60% pressure Rt LB    Lumbar - Left Rotation 50%      Strength   Overall Strength Comments WFL's      Flexibility   Hamstrings tight Rt 50 deg; Lt 45 deg    Quadriceps tight end range bilat    ITB WFL's    Piriformis tight Rt > Lt      Palpation   Spinal mobility hypomobile lumbar spine L2/3/4/5 with PA mobs    Palpation comment muscular tightness Rt lumbar paraspinals to lats      Special Tests   Other special  tests (-) SLR      Balance   Balance Assessed --   SLS Rt/Lt ~ 10 sec                       Objective measurements completed on examination: See above findings.       [facility] Adult PT Treatment/Exercise - 01/07/22 0001       Self-Care   Self-Care Other Self-Care Comments    Other Self-Care Comments  sitting posture modifying recliner      Therapeutic Activites    Therapeutic Activities Other Therapeutic Activities    Other Therapeutic Activities myofacial ball relesae Rt LB standing; importance of posture and alignment; exercise; flexibility; core strength;      Lumbar Exercises: Stretches   Passive Hamstring Stretch Right;2 reps;30 seconds    Passive Hamstring Stretch Limitations supine with strap; sitting hinged hips    Hip Flexor Stretch Limitations need to add    Prone on Elbows Stretch 60 seconds    Prone on Elbows Stretch Limitations 2 reps    Press Ups 5 reps    Press Ups Limitations 2-3 sec hold to pt's comfort - partial range    Piriformis Stretch Right;2 reps;30 seconds    Piriformis Stretch Limitations supine travell with strap      Lumbar Exercises: Seated   Sit to Stand 10 reps    Sit to Stand Limitations VC for core engaged and hinged hips; slow eccentric stand to sit      Lumbar Exercises: Supine   AB Set Limitations 4 part core 10 sec hold x 10 reps                     PT Education - 01/07/22 1239     Education Details POC HEP    Person(s) Educated Patient    Methods Explanation;Demonstration;Tactile cues;Verbal cues;Handout    Comprehension Verbalized understanding;Returned demonstration;Verbal cues required;Tactile cues required;Need further instruction;Other (comment)                 PT Long Term Goals - 01/07/22 1303  PT LONG TERM GOAL #1   Title Decrease LB and Rt LE pain and radicular symptoms by 75-100% allowing patient to return to normal functional and recreational activities    Time 6    Status New     Target Date 02/18/22      PT LONG TERM GOAL #2   Title Improve tissue extensibility and joint ROM through the lumbar spine and bilat LE's to avoid recurrent LBP    Time 6    Period Weeks    Status New    Target Date 02/18/22      PT LONG TERM GOAL #3   Title Patient to demonstrate good core strength/stability to tolerate 30 min of exercise with no pain    Time 6    Period Weeks    Status New    Target Date 02/18/22      PT LONG TERM GOAL #4   Title Independent in HEP    Time 6    Period Weeks    Status New    Target Date 02/18/22      PT LONG TERM GOAL #5   Title Improve functional limitation score to 69    Time 6    Period Weeks    Status New    Target Date 02/18/22                    Plan - 01/07/22 1258     Clinical Impression Statement Patient presents with ~ 3 week history of LBP and Rt LE radicular pain following bending forward to fed his cat. He has noted some improvement with medication and rest but continues to have some tightness in the Rt LB and intermittent radicular pain into the Rt anterior thigh to knee. Patient has poor posture and alignment; limited trunk and LE mobility/ROM; muscular tightness to palpation through  the Rt lumbar paraspinals/lats; spinal hypomobility through the umbar spine; core weakness; decreased physical conditioning. Patient will benefit from PT to address problems identified.    Stability/Clinical Decision Making Stable/Uncomplicated    Clinical Decision Making Low    Rehab Potential Good    PT Frequency 2x / week    PT Duration 6 weeks    PT Treatment/Interventions ADLs/Self Care Home Management;Aquatic Therapy;Cryotherapy;Electrical Stimulation;Iontophoresis 4mg /ml Dexamethasone;Moist Heat;Ultrasound;Functional mobility training;Therapeutic activities;Therapeutic exercise;Balance training;Neuromuscular re-education;Patient/family education;Manual techniques;Dry needling;Taping    PT Next Visit Plan review and progress  HEP; add hip flexor stretch; DN/manual work Rt lumbar spine musculature; core strengthening/stabilization; back education; body mechanics; modalities as indicated    PT Home Exercise Plan [email or web address]    Consulted and Agree with Plan of Care Patient             Patient will benefit from skilled therapeutic intervention in order to improve the following deficits and impairments:  Decreased range of motion, Pain, Decreased activity tolerance, Hypomobility, Impaired flexibility, Improper body mechanics, Decreased mobility, Decreased strength, Postural dysfunction  Visit Diagnosis: Radiculopathy, lumbar region  Other symptoms and signs involving the musculoskeletal system  Muscle weakness (generalized)     Problem List Patient Active Problem List   Diagnosis Date Noted   DDD (degenerative disc disease), cervical 12/28/2021   Lumbar spinal stenosis 08/14/2019   Rosacea 08/04/2017   History of cholecystectomy 02/05/2016   Subclinical hypothyroidism 09/08/2015   Prediabetes 09/08/2015   Obesity 09/08/2015   Right thyroid nodule 09/07/2015   Hyperlipidemia 10/17/2014   Bilateral knee pain 06/07/2013   Annual physical exam 05/09/2013   Essential hypertension, benign  05/09/2013    Rosell Khouri Nilda Simmer, PT, MPH  01/07/2022, 1:06 PM  [facility] [place], [place], [address] Phone: [phone]   Fax:  [phone]  Name: Giovoni Hefferon MRN: [id number] Date of Birth: 1971/01/05

## 2022-01-07 NOTE — Patient Instructions (Signed)
Access Code: [email or web address] URL: [email or web address] Date: 01/07/2022 Prepared by: Corlis Leak  Exercises Prone Press Up On Elbows - 2 x daily - 7 x weekly - 1 sets - 3 reps - 30 sec hold Prone Press Up - 2 x daily - 7 x weekly - 1 sets - 10 reps - 2-3 sec hold Hooklying Hamstring Stretch with Strap - 2 x daily - 7 x weekly - 1 sets - 3 reps - 30 sec hold Supine Piriformis Stretch with Leg Straight - 2 x daily - 7 x weekly - 1 sets - 3 reps - 30 sec hold Supine Transversus Abdominis Bracing with Pelvic Floor Contraction - 2 x daily - 7 x weekly - 1 sets - 10 reps - 10sec hold Sit to Stand - 2 x daily - 7 x weekly - 1 sets - 10 reps - 3-5 sec hold  Patient Education Brewing technologist Trigger Point Dry Needling

## 2022-01-19 ENCOUNTER — Ambulatory Visit: Payer: 59 | Admitting: Physical Therapy

## 2022-01-21 ENCOUNTER — Ambulatory Visit: Payer: 59 | Admitting: Physical Therapy

## 2022-01-21 ENCOUNTER — Other Ambulatory Visit: Payer: Self-pay

## 2022-01-21 DIAGNOSIS — M5416 Radiculopathy, lumbar region: Secondary | ICD-10-CM

## 2022-01-21 DIAGNOSIS — M48061 Spinal stenosis, lumbar region without neurogenic claudication: Secondary | ICD-10-CM | POA: Diagnosis not present

## 2022-01-21 DIAGNOSIS — R29898 Other symptoms and signs involving the musculoskeletal system: Secondary | ICD-10-CM

## 2022-01-21 DIAGNOSIS — M6281 Muscle weakness (generalized): Secondary | ICD-10-CM

## 2022-01-21 NOTE — Therapy (Signed)
[place], [place], [address] Phone: [phone]   Fax:  [phone]  Physical Therapy Treatment  Patient Details  Name: Alan Gardner MRN: [id number] Date of Birth: [birth year] Referring Provider (PT): Dr Dianah Field   Encounter Date: 01/21/2022   PT End of Session - 01/21/22 0848     Visit Number 2    Number of Visits 12    Date for PT Re-Evaluation 02/18/22    PT Start Time 0848    PT Stop Time 0933    PT Time Calculation (min) 45 min    Activity Tolerance Patient tolerated treatment well             Past Medical History:  Diagnosis Date   Hyperlipemia    Hypertension     Past Surgical History:  Procedure Laterality Date   KIDNEY STONE SURGERY      There were no vitals filed for this visit.   Subjective Assessment - 01/21/22 0848     Subjective Pt states he has been feeling better. Pt returns from vacation. Pt notes that back can still hurt when he bends or twist awkwardly.    Pertinent History prior LBP 2020 and intermittently over the past 10 years; LB strain when in the [employer] 30 years ago; arthritic knees    Patient Stated Goals get rid of pain; to avoid recurrent back pain    Currently in Pain? Yes    Pain Score 1     Pain Location Back    Pain Onset 1 to 4 weeks ago                [facility] PT Assessment - 01/21/22 0001       Assessment   Medical Diagnosis LBP; Lumbar spinal stenosis    Referring Provider (PT) Dr Dianah Field    Onset Date/Surgical Date 12/22/21    Hand Dominance Right    Next MD Visit 02/11/22    Prior Therapy here for Lt shoulder; Rt LBP 2020                           [facility] Adult PT Treatment/Exercise - 01/21/22 0001       Lumbar Exercises: Stretches   Passive Hamstring Stretch Right;2 reps;30 seconds    Passive Hamstring Stretch Limitations supine with strap; sitting hinged hips    Hip Flexor Stretch Right;Left;20  seconds;3 reps    Hip Flexor Stretch Limitations standing    Prone on Elbows Stretch 2 reps;30 seconds    Press Ups 10 reps    Press Ups Limitations 2-3 sec hold to pt's comfort - partial range    Piriformis Stretch Right;2 reps;30 seconds      Lumbar Exercises: Standing   Other Standing Lumbar Exercises Deadlift 10# KB x5   trial     Lumbar Exercises: Seated   Sit to Stand 20 reps    Sit to Stand Limitations hip hinge with 10# KB      Lumbar Exercises: Supine   AB Set Limitations 4 part core 10 sec hold x 3 reps    Bent Knee Raise 10 reps    Bent Knee Raise Limitations with knee extension after knee raise                     PT Education - 01/21/22 0939     Education Details Discussed lifting mechanics and slow progression of strengthening for safe  return to work lifting.    Person(s) Educated Patient    Methods Explanation;Demonstration;Tactile cues;Verbal cues    Comprehension Verbalized understanding;Returned demonstration;Verbal cues required;Tactile cues required                 PT Long Term Goals - 01/21/22 0908       PT LONG TERM GOAL #1   Title Decrease LB and Rt LE pain and radicular symptoms by 75-100% allowing patient to return to normal functional and recreational activities    Time 6    Status Achieved    Target Date 02/18/22      PT LONG TERM GOAL #2   Title Improve tissue extensibility and joint ROM through the lumbar spine and bilat LE's to avoid recurrent LBP    Time 6    Period Weeks    Status On-going    Target Date 02/18/22      PT LONG TERM GOAL #3   Title Patient to demonstrate good core strength/stability to tolerate 30 min of exercise with no pain    Time 6    Period Weeks    Status On-going    Target Date 02/18/22      PT LONG TERM GOAL #4   Title Independent in HEP    Time 6    Period Weeks    Status On-going    Target Date 02/18/22      PT LONG TERM GOAL #5   Title Improve functional limitation score to 69     Time 6    Period Weeks    Status On-going    Target Date 02/18/22                   Plan - 01/21/22 0939     Clinical Impression Statement Pt's pain has continued to improve. No longer feeling radicular pain. Pt returns after vacation -- reviewed HEP and progressed accordingly. Rest of session focused on lifting mechanics to progress strengthening for safe return to work. Difficulty with deadlifting due to his tight hamstrings.    Stability/Clinical Decision Making Stable/Uncomplicated    Rehab Potential Good    PT Frequency 2x / week    PT Duration 6 weeks    PT Treatment/Interventions ADLs/Self Care Home Management;Aquatic Therapy;Cryotherapy;Electrical Stimulation;Iontophoresis 4mg /ml Dexamethasone;Moist Heat;Ultrasound;Functional mobility training;Therapeutic activities;Therapeutic exercise;Balance training;Neuromuscular re-education;Patient/family education;Manual techniques;Dry needling;Taping    PT Next Visit Plan review and progress HEP; add hip flexor stretch; DN/manual work Rt lumbar spine musculature; core strengthening/stabilization; back education; body mechanics; modalities as indicated    PT Home Exercise Plan [email or web address]    Consulted and Agree with Plan of Care Patient             Patient will benefit from skilled therapeutic intervention in order to improve the following deficits and impairments:  Decreased range of motion, Pain, Decreased activity tolerance, Hypomobility, Impaired flexibility, Improper body mechanics, Decreased mobility, Decreased strength, Postural dysfunction  Visit Diagnosis: Radiculopathy, lumbar region  Other symptoms and signs involving the musculoskeletal system  Muscle weakness (generalized)     Problem List Patient Active Problem List   Diagnosis Date Noted   DDD (degenerative disc disease), cervical 12/28/2021   Lumbar spinal stenosis 08/14/2019   Rosacea 08/04/2017   History of cholecystectomy 02/05/2016   Subclinical  hypothyroidism 09/08/2015   Prediabetes 09/08/2015   Obesity 09/08/2015   Right thyroid nodule 09/07/2015   Hyperlipidemia 10/17/2014   Bilateral knee pain 06/07/2013   Annual physical exam 05/09/2013   Essential hypertension,  benign 05/09/2013    [facility] April Ma L Victoriah Wilds, PT, DPT 01/21/2022, 9:41 AM  [facility] [place], [place], [address] Phone: [phone]   Fax:  [phone]  Name: Eathan Venis MRN: [id number] Date of Birth: Jun 25, 1971

## 2022-01-28 ENCOUNTER — Ambulatory Visit: Payer: 59 | Attending: Sports Medicine | Admitting: Physical Therapy

## 2022-01-28 ENCOUNTER — Other Ambulatory Visit: Payer: Self-pay

## 2022-01-28 DIAGNOSIS — M5416 Radiculopathy, lumbar region: Secondary | ICD-10-CM | POA: Insufficient documentation

## 2022-01-28 DIAGNOSIS — R29898 Other symptoms and signs involving the musculoskeletal system: Secondary | ICD-10-CM | POA: Insufficient documentation

## 2022-01-28 DIAGNOSIS — M6281 Muscle weakness (generalized): Secondary | ICD-10-CM | POA: Insufficient documentation

## 2022-01-28 NOTE — Therapy (Signed)
[place] [facility] ?[place], [place], [address] ?Phone: [phone]   Fax:  [phone] ? ?Physical Therapy Treatment and Discharge ? ?Patient Details  ?Name: Alan Gardner ?MRN: [id number] ?Date of Birth: February 10, 1971 ?Referring Provider (PT): Dr Dianah Field ? ?PHYSICAL THERAPY DISCHARGE SUMMARY ? ?Visits from Start of Care: 3 ? ?Current functional level related to goals / functional outcomes: ?See below ?  ?Remaining deficits: ?See below ?  ?Education / Equipment: ?See below  ? ?Patient agrees to discharge. Patient goals were met. Patient is being discharged due to meeting the stated rehab goals. ? ? ?Encounter Date: 01/28/2022 ? ? PT End of Session - 01/28/22 1531   ? ? Visit Number 3   ? Number of Visits 12   ? Date for PT Re-Evaluation 02/18/22   ? PT Start Time 3300   ? PT Stop Time 1615   ? PT Time Calculation (min) 44 min   ? Activity Tolerance Patient tolerated treatment well   ? Behavior During Therapy [facility] for tasks assessed/performed   ? ?  ?  ? ?  ? ? ?Past Medical History:  ?Diagnosis Date  ? Hyperlipemia   ? Hypertension   ? ? ?Past Surgical History:  ?Procedure Laterality Date  ? KIDNEY STONE SURGERY    ? ? ?There were no vitals filed for this visit. ? ? Subjective Assessment - 01/28/22 1534   ? ? Subjective Pt reports no pain. Pt states he has been doing the exercises "some" at home.   ? Pertinent History prior LBP 2020 and intermittently over the past 10 years; LB strain when in the [employer] 30 years ago; arthritic knees   ? Patient Stated Goals get rid of pain; to avoid recurrent back pain   ? Currently in Pain? No/denies   ? Pain Onset 1 to 4 weeks ago   ? ?  ?  ? ?  ? ? ? ? ? [facility] PT Assessment - 01/28/22 0001   ? ?  ? Assessment  ? Medical Diagnosis LBP; Lumbar spinal stenosis   ? Referring Provider (PT) Dr Dianah Field   ? Onset Date/Surgical Date 12/22/21   ? Hand Dominance Right   ? Next MD Visit 02/11/22   ? Prior Therapy here for Lt  shoulder; Rt LBP 2020   ?  ? Observation/Other Assessments  ? Focus on Therapeutic Outcomes (FOTO)  94   ?  ? AROM  ? Lumbar Flexion 75%   limited due to hamstring  ? Lumbar Extension WFL   ? Lumbar - Right Side [facility]   ? Lumbar - Left Side Bend WFL   ? Lumbar - Right Rotation WFL   ? Lumbar - Left Rotation WFL   ?  ? Flexibility  ? Hamstrings Rt 60 deg, Lt 68 deg   ? ?  ?  ? ?  ? ? ? ? ? ? ? ? ? ? ? ? ? ? ? ? [facility] Adult PT Treatment/Exercise - 01/28/22 0001   ? ?  ? Lumbar Exercises: Stretches  ? Passive Hamstring Stretch Right;Left;30 seconds   ? Passive Hamstring Stretch Limitations seated & then standing   ?  ? Lumbar Exercises: Standing  ? Shoulder Extension Strengthening;20 reps;Theraband   ? Theraband Level (Shoulder Extension) Level 3 (Green)   ? Other Standing Lumbar Exercises Palloff press green tband 2x10 each side   ? Other Standing Lumbar Exercises Anti rotation green tband with side stepping x10   ?  ?  Lumbar Exercises: Supine  ? Bent Knee Raise Limitations with knee extension after knee raise x10; double knee raise x10; double knee raise with alternating arm x10   ? ?  ?  ? ?  ? ? ? ? ? ? ? ? ? ? PT Education - 01/28/22 1619   ? ? Education Details Discussed final HEP and how to self progress his exercises at home.   ? Person(s) Educated Patient   ? Methods Explanation;Demonstration;Tactile cues;Verbal cues;Handout   ? Comprehension Verbalized understanding;Returned demonstration;Verbal cues required;Tactile cues required   ? ?  ?  ? ?  ? ? ? ? ? ? PT Long Term Goals - 01/28/22 1556   ? ?  ? PT LONG TERM GOAL #1  ? Title Decrease LB and Rt LE pain and radicular symptoms by 75-100% allowing patient to return to normal functional and recreational activities   ? Time 6   ? Status Achieved   ? Target Date 02/18/22   ?  ? PT LONG TERM GOAL #2  ? Title Improve tissue extensibility and joint ROM through the lumbar spine and bilat LE's to avoid recurrent LBP   ? Time 6   ? Period Weeks   ? Status Achieved    ? Target Date 02/18/22   ?  ? PT LONG TERM GOAL #3  ? Title Patient to demonstrate good core strength/stability to tolerate 30 min of exercise with no pain   ? Time 6   ? Period Weeks   ? Status Achieved   ? Target Date 02/18/22   ?  ? PT LONG TERM GOAL #4  ? Title Independent in Early   ? Time 6   ? Period Weeks   ? Status Achieved   ? Target Date 02/18/22   ?  ? PT LONG TERM GOAL #5  ? Title Improve functional limitation score to 69   ? Time 6   ? Period Weeks   ? Status Achieved   ? Target Date 02/18/22   ? ?  ?  ? ?  ? ? ? ? ? ? ? ? Plan - 01/28/22 1600   ? ? Clinical Impression Statement Treatment focused on establishing advanced HEP for core strengthening and continued hip flexibility. Pt has met his LTGs and feels ready for PT d/c. Discussed holding PT for a month in case of any flare ups.   ? Stability/Clinical Decision Making Stable/Uncomplicated   ? Rehab Potential Good   ? PT Frequency 2x / week   ? PT Duration 6 weeks   ? PT Treatment/Interventions ADLs/Self Care Home Management;Aquatic Therapy;Cryotherapy;Electrical Stimulation;Iontophoresis 4mg /ml Dexamethasone;Moist Heat;Ultrasound;Functional mobility training;Therapeutic activities;Therapeutic exercise;Balance training;Neuromuscular re-education;Patient/family education;Manual techniques;Dry needling;Taping   ? PT Next Visit Plan review and progress HEP; add hip flexor stretch; DN/manual work Rt lumbar spine musculature; core strengthening/stabilization; back education; body mechanics; modalities as indicated   ? PT Home Exercise Plan [email or web address]   ? Consulted and Agree with Plan of Care Patient   ? ?  ?  ? ?  ? ? ?Patient will benefit from skilled therapeutic intervention in order to improve the following deficits and impairments:  Decreased range of motion, Pain, Decreased activity tolerance, Hypomobility, Impaired flexibility, Improper body mechanics, Decreased mobility, Decreased strength, Postural dysfunction ? ?Visit Diagnosis: ?Radiculopathy,  lumbar region ? ?Other symptoms and signs involving the musculoskeletal system ? ?Muscle weakness (generalized) ? ? ? ? ?Problem List ?Patient Active Problem List  ? Diagnosis Date Noted  ? DDD (degenerative disc  disease), cervical 12/28/2021  ? Lumbar spinal stenosis 08/14/2019  ? Rosacea 08/04/2017  ? History of cholecystectomy 02/05/2016  ? Subclinical hypothyroidism 09/08/2015  ? Prediabetes 09/08/2015  ? Obesity 09/08/2015  ? Right thyroid nodule 09/07/2015  ? Hyperlipidemia 10/17/2014  ? Bilateral knee pain 06/07/2013  ? Annual physical exam 05/09/2013  ? Essential hypertension, benign 05/09/2013  ? ? ?Yena Tisby April Gordy Levan, PT, DPT ?01/28/2022, 4:21 PM ? ?[place] ?[facility] ?[address] ?[place], [place], [address] ?Phone: [phone]   Fax:  [phone] ? ?Name: Alan Gardner ?MRN: [id number] ?Date of Birth: [birth year] ? ? ? ?

## 2022-02-11 ENCOUNTER — Ambulatory Visit: Payer: 59 | Admitting: Sports Medicine

## 2022-05-16 ENCOUNTER — Other Ambulatory Visit: Payer: Self-pay

## 2022-05-16 DIAGNOSIS — E038 Other specified hypothyroidism: Secondary | ICD-10-CM

## 2022-05-16 MED ORDER — LEVOTHYROXINE SODIUM 50 MCG PO TABS: 50.0000 ug | ORAL_TABLET | Freq: Every day | ORAL | 0 refills | Status: DC

## 2023-02-06 ENCOUNTER — Ambulatory Visit
Admission: EM | Admit: 2023-02-06 | Discharge: 2023-02-06 | Disposition: A | Discharge status: Home / Self Care | Payer: 59 | Attending: Family Medicine | Admitting: Family Medicine

## 2023-02-06 ENCOUNTER — Encounter: Payer: Self-pay | Admitting: Emergency Medicine

## 2023-02-06 DIAGNOSIS — J988 Other specified respiratory disorders: Secondary | ICD-10-CM | POA: Diagnosis not present

## 2023-02-06 DIAGNOSIS — B9789 Other viral agents as the cause of diseases classified elsewhere: Secondary | ICD-10-CM | POA: Diagnosis not present

## 2023-02-06 LAB — POCT INFLUENZA A/B
Influenza A, POC: NEGATIVE
Influenza B, POC: NEGATIVE

## 2023-02-06 NOTE — ED Provider Notes (Signed)
Alan Gardner CARE    CSN: [id number] Arrival date & time: 02/06/23  1419      History   Chief Complaint Chief Complaint  Patient presents with   Sinus infection    HPI Alan Gardner is a 52 y.o. male.   HPI  Patient is on his fourth day of illness.  He fears he may have the flu.  He states he aches all over.  "Even my eyes hurt".  He has sinus pressure, sinus drainage, sore throat, mild cough.  He has had fever and chills.  Body aches. Did a COVID test at home that was negative  Past Medical History:  Diagnosis Date   Hyperlipemia    Hypertension     Patient Active Problem List   Diagnosis Date Noted   DDD (degenerative disc disease), cervical 12/28/2021   Lumbar spinal stenosis 08/14/2019   Rosacea 08/04/2017   History of cholecystectomy 02/05/2016   Subclinical hypothyroidism 09/08/2015   Prediabetes 09/08/2015   Obesity 09/08/2015   Right thyroid nodule 09/07/2015   Hyperlipidemia 10/17/2014   Bilateral knee pain 06/07/2013   Annual physical exam 05/09/2013   Essential hypertension, benign 05/09/2013    Past Surgical History:  Procedure Laterality Date   KIDNEY STONE SURGERY         Home Medications    Prior to Admission medications   Medication Sig Start Date End Date Taking? Authorizing Provider  levothyroxine (SYNTHROID) 50 MCG tablet Take 1 tablet (50 mcg total) by mouth daily before breakfast. NEEDS APPT FOR REFILLS. CALL OFFICE TO SCHEDULE. 05/16/22  Yes Silverio Decamp, MD  lisinopril-hydrochlorothiazide (ZESTORETIC) 20-12.5 MG tablet TAKE 1 TABLET BY MOUTH DAILY. FOLLOW UP VISIT 11/12/19  Yes Silverio Decamp, MD  atorvastatin (LIPITOR) 10 MG tablet TAKE 1 TABLET(10 MG) BY MOUTH DAILY 08/10/20   Silverio Decamp, MD    Family History Family History  Problem Relation Age of Onset   Hypertension Mother    Diabetes Father     Social History Social History   Tobacco Use   Smoking status: Never   Smokeless tobacco:  Never  Vaping Use   Vaping Use: Never used  Substance Use Topics   Alcohol use: Yes   Drug use: No     Allergies   Patient has no known allergies.   Review of Systems Review of Systems See HPI  Physical Exam Triage Vital Signs ED Triage Vitals  Enc Vitals Group     BP 02/06/23 1438 (!) 152/105     Pulse Rate 02/06/23 1438 84     Resp 02/06/23 1438 18     Temp 02/06/23 1438 98.5 F (36.9 C)     Temp Source 02/06/23 1438 Oral     SpO2 02/06/23 1438 96 %     Weight 02/06/23 1440 270 lb (122.5 kg)     Height 02/06/23 1440 '6\' 1"'$  (1.854 m)     Head Circumference --      Peak Flow --      Pain Score 02/06/23 1440 0     Pain Loc --      Pain Edu? --      Excl. in [place]? --    No data found.  Updated Vital Signs BP (!) 151/111 (BP Location: Right Arm)   Pulse 77   Temp 98.2 F (36.8 C) (Oral)   Resp 18   Ht '6\' 1"'$  (1.854 m)   Wt 122.5 kg   SpO2 97%   BMI 35.62 kg/m  Physical Exam Constitutional:      General: He is not in acute distress.    Appearance: He is well-developed. He is ill-appearing.  HENT:     Head: Normocephalic and atraumatic.     Right Ear: Tympanic membrane and ear canal normal.     Left Ear: Tympanic membrane and ear canal normal.     Nose: Congestion present.     Mouth/Throat:     Pharynx: Posterior oropharyngeal erythema present.  Eyes:     Conjunctiva/sclera: Conjunctivae normal.     Pupils: Pupils are equal, round, and reactive to light.  Cardiovascular:     Rate and Rhythm: Normal rate and regular rhythm.     Heart sounds: Normal heart sounds.  Pulmonary:     Effort: Pulmonary effort is normal. No respiratory distress.     Breath sounds: Normal breath sounds.  Abdominal:     General: There is no distension.     Palpations: Abdomen is soft.  Musculoskeletal:        General: Normal range of motion.     Cervical back: Normal range of motion.  Lymphadenopathy:     Cervical: No cervical adenopathy.  Skin:    General: Skin is  warm and dry.  Neurological:     Mental Status: He is alert.      UC Treatments / Results  Labs (all labs ordered are listed, but only abnormal results are displayed) Labs Reviewed  POCT INFLUENZA A/B    EKG   Radiology No results found.  Procedures Procedures (including critical care time)  Medications Ordered in UC Medications - No data to display  Initial Impression / Assessment and Plan / UC Course  I have reviewed the triage vital signs and the nursing notes.  Pertinent labs & imaging results that were available during my care of the patient were reviewed by me and considered in my medical decision making (see chart for details).     The flu test is negative.  Patient has a respiratory virus.  Patient thinks he has a "sinus infection" that may need antibiotics.  I told him there was no indication for antibiotics at this time, and that his sinus infection was overwhelmingly likely to be a virus Final Clinical Impressions(s) / UC Diagnoses   Final diagnoses:  Viral respiratory illness     Discharge Instructions      Make sure you are drinking lots of water I recommend Flonase for the nasal congestion Take Mucinex DM if needed for cough Take ibuprofen or Tylenol for pain and fever Expect resolution over the next few days No indication for antibiotics at this time      ED Prescriptions   None    PDMP not reviewed this encounter.   Raylene Everts, MD 02/06/23 [phone]

## 2023-02-06 NOTE — ED Triage Notes (Signed)
Patient c/o possible sinus infection, nasal drainage, cough, fever, sore throat x 4 days.  Patient has taken Dayquil and Ibuprofen.

## 2023-02-06 NOTE — Discharge Instructions (Addendum)
Make sure you are drinking lots of water I recommend Flonase for the nasal congestion Take Mucinex DM if needed for cough Take ibuprofen or Tylenol for pain and fever Expect resolution over the next few days No indication for antibiotics at this time

## 2023-05-25 ENCOUNTER — Ambulatory Visit (INDEPENDENT_AMBULATORY_CARE_PROVIDER_SITE_OTHER): Payer: 59 | Admitting: Sports Medicine

## 2023-05-25 ENCOUNTER — Encounter: Payer: Self-pay | Admitting: Sports Medicine

## 2023-05-25 VITALS — BP 125/89 | HR 69 | Wt 274.0 lb

## 2023-05-25 DIAGNOSIS — E7800 Pure hypercholesterolemia, unspecified: Secondary | ICD-10-CM | POA: Diagnosis not present

## 2023-05-25 DIAGNOSIS — N139 Obstructive and reflux uropathy, unspecified: Secondary | ICD-10-CM | POA: Diagnosis not present

## 2023-05-25 DIAGNOSIS — Z0000 Encounter for general adult medical examination without abnormal findings: Secondary | ICD-10-CM | POA: Diagnosis not present

## 2023-05-25 DIAGNOSIS — L719 Rosacea, unspecified: Secondary | ICD-10-CM

## 2023-05-25 DIAGNOSIS — D229 Melanocytic nevi, unspecified: Secondary | ICD-10-CM | POA: Diagnosis not present

## 2023-05-25 DIAGNOSIS — Z1211 Encounter for screening for malignant neoplasm of colon: Secondary | ICD-10-CM | POA: Diagnosis not present

## 2023-05-25 DIAGNOSIS — E038 Other specified hypothyroidism: Secondary | ICD-10-CM

## 2023-05-25 NOTE — Assessment & Plan Note (Signed)
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 I have asked him to consider discussion of laser treatment of his rosacea to avoid long-term complications of antibiotic use chronically.

## 2023-05-25 NOTE — Assessment & Plan Note (Signed)
Fasting annual physical as above, we will give him some information on Shingrix and defer it. Routine labs today. Adding Cologuard. Return in a year for annual fasting physical.

## 2023-05-25 NOTE — Assessment & Plan Note (Signed)
Cryotherapy as above, we will bring him back in a month.

## 2023-05-25 NOTE — Progress Notes (Addendum)
Subjective:    CC: Annual Physical Exam  HPI:  This patient is here for their annual physical  I reviewed the past medical history, family history, social history, surgical history, and allergies today and no changes were needed.  Please see the problem list section below in epic for further details.  Past Medical History: Past Medical History:  Diagnosis Date   Hyperlipemia    Hypertension    Past Surgical History: Past Surgical History:  Procedure Laterality Date   KIDNEY STONE SURGERY     Social History: Social History   Socioeconomic History   Marital status: Married    Spouse name: Not on file   Number of children: Not on file   Years of education: Not on file   Highest education level: Not on file  Occupational History   Not on file  Tobacco Use   Smoking status: Never   Smokeless tobacco: Never  Vaping Use   Vaping Use: Never used  Substance and Sexual Activity   Alcohol use: Yes   Drug use: No   Sexual activity: Not Currently  Other Topics Concern   Not on file  Social History Narrative   Not on file   Social Determinants of Health   Financial Resource Strain: Not on file  Food Insecurity: Not on file  Transportation Needs: Not on file  Physical Activity: Not on file  Stress: Not on file  Social Connections: Not on file   Family History: Family History  Problem Relation Age of Onset   Hypertension Mother    Diabetes Father    Allergies: No Known Allergies Medications: See med rec.  Review of Systems: No headache, visual changes, nausea, vomiting, diarrhea, constipation, dizziness, abdominal pain, skin rash, fevers, chills, night sweats, swollen lymph nodes, weight loss, chest pain, body aches, joint swelling, muscle aches, shortness of breath, mood changes, visual or auditory hallucinations.  Objective:    General: Well Developed, well nourished, and in no acute distress.  Neuro: Alert and oriented x3, extra-ocular muscles intact,  sensation grossly intact. Cranial nerves II through XII are intact, motor, sensory, and coordinative functions are all intact. HEENT: Normocephalic, atraumatic, pupils equal round reactive to light, neck supple, no masses, no lymphadenopathy, thyroid nonpalpable. Oropharynx, nasopharynx, external ear canals are unremarkable. Skin: Warm and dry, no rashes noted.  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 Cardiac: Regular rate and rhythm, no murmurs rubs or gallops.  Respiratory: Clear to auscultation bilaterally. Not using accessory muscles, speaking in full sentences.  Abdominal: Soft, nontender, nondistended, positive bowel sounds, no masses, no organomegaly.  Musculoskeletal: Shoulder, elbow, wrist, hip, knee, ankle stable, and with full range of motion.  Procedure:  Cryodestruction of left posterior scalp nevus sebaceous Consent obtained and verified. Time-out conducted. Noted no overlying erythema, induration, or other signs of local infection. Completed without difficulty using Cryo-Gun. Advised to call if fevers/chills, erythema, induration, drainage, or persistent bleeding.  Impression and Recommendations:    The patient was counselled, risk factors were discussed, anticipatory guidance given.  Annual physical exam Fasting annual physical as above, we will give him some information on Shingrix and defer it. Routine labs today. Adding Cologuard. Return in a year for annual fasting physical.  Nevus sebaceous of [place] Cryotherapy as above, we will bring him back in a month.  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 I have asked him to consider discussion of laser treatment of his  rosacea to avoid long-term complications of antibiotic use chronically.  Hyperlipidemia Elevated, off of atorvastatin, we will restart cholesterol medication, rosuvastatin  10 with a 18-month recheck.  Subclinical hypothyroidism Needs to restart levothyroxine, recheck TSH in 2 months to coincide with lipid panel.   ____________________________________________ Ihor Austin. Benjamin Stain, M.D., ABFM., CAQSM., AME. Primary Care and Sports Medicine [place] [facility]  Adjunct Professor of Family Medicine  [place] of [facility] [employer]  Restaurant manager, fast food

## 2023-05-26 ENCOUNTER — Other Ambulatory Visit: Payer: Self-pay | Admitting: Sports Medicine

## 2023-05-26 ENCOUNTER — Encounter: Payer: Self-pay | Admitting: Sports Medicine

## 2023-05-26 DIAGNOSIS — I10 Essential (primary) hypertension: Secondary | ICD-10-CM

## 2023-05-26 DIAGNOSIS — E7800 Pure hypercholesterolemia, unspecified: Secondary | ICD-10-CM

## 2023-05-26 DIAGNOSIS — E038 Other specified hypothyroidism: Secondary | ICD-10-CM

## 2023-05-26 MED ORDER — LEVOTHYROXINE SODIUM 50 MCG PO TABS: 50.0000 ug | ORAL_TABLET | Freq: Every day | ORAL | 3 refills | Status: DC

## 2023-05-26 MED ORDER — ROSUVASTATIN CALCIUM 10 MG PO TABS: 10.0000 mg | ORAL_TABLET | Freq: Every day | ORAL | 3 refills | Status: AC

## 2023-05-26 NOTE — Addendum Note (Signed)
Addended by: Monica Becton on: 05/26/2023 03:42 PM   Modules accepted: Orders

## 2023-05-26 NOTE — Assessment & Plan Note (Signed)
Needs to restart levothyroxine, recheck TSH in 2 months to coincide with lipid panel.

## 2023-05-26 NOTE — Telephone Encounter (Signed)
spoke with patient.  He has been out of levothyroxine completely for a couple of weeks. He does have some atorvastatin 10mg  but this was written in 2021.

## 2023-05-26 NOTE — Telephone Encounter (Signed)
Prescription refill request forwarded to Dr. Benjamin Stain.

## 2023-05-26 NOTE — Assessment & Plan Note (Signed)
Elevated, off of atorvastatin, we will restart cholesterol medication, rosuvastatin 10 with a 31-month recheck.

## 2023-05-29 ENCOUNTER — Encounter: Payer: Self-pay | Admitting: Sports Medicine

## 2023-05-29 LAB — LIPID PANEL
Cholesterol: 219 mg/dL — ABNORMAL HIGH (ref <200)
HDL: 43 mg/dL (ref >=40)
LDL Cholesterol (Calc): 149 mg/dL (calc) — ABNORMAL HIGH
Non-HDL Cholesterol (Calc): 176 mg/dL (calc) — ABNORMAL HIGH (ref <130)
Total CHOL/HDL Ratio: 5.1 (calc) — ABNORMAL HIGH (ref <5.0)
Triglycerides: 148 mg/dL (ref <150)

## 2023-05-29 LAB — COMPREHENSIVE METABOLIC PANEL
AG Ratio: 1.5 (calc) (ref 1.0–2.5)
ALT: 21 U/L (ref 9–46)
AST: 15 U/L (ref 10–35)
Albumin: 4.5 g/dL (ref 3.6–5.1)
Alkaline phosphatase (APISO): 85 U/L (ref 35–144)
BUN: 25 mg/dL (ref 7–25)
CO2: 27 mmol/L (ref 20–32)
Calcium: 9.9 mg/dL (ref 8.6–10.3)
Chloride: 106 mmol/L (ref 98–110)
Creat: 0.98 mg/dL (ref 0.70–1.30)
Globulin: 3.1 g/dL (calc) (ref 1.9–3.7)
Glucose, Bld: 83 mg/dL (ref 65–99)
Potassium: 3.9 mmol/L (ref 3.5–5.3)
Sodium: 145 mmol/L (ref 135–146)
Total Bilirubin: 1 mg/dL (ref 0.2–1.2)
Total Protein: 7.6 g/dL (ref 6.1–8.1)

## 2023-05-29 LAB — CBC
HCT: 45.2 % (ref 38.5–50.0)
Hemoglobin: 15.3 g/dL (ref 13.2–17.1)
MCH: 29.2 pg (ref 27.0–33.0)
MCHC: 33.8 g/dL (ref 32.0–36.0)
MCV: 86.3 fL (ref 80.0–100.0)
MPV: 8.6 fL (ref 7.5–12.5)
Platelets: 271 10*3/uL (ref 140–400)
RBC: 5.24 10*6/uL (ref 4.20–5.80)
RDW: 13.8 % (ref 11.0–15.0)
WBC: 9.3 10*3/uL (ref 3.8–10.8)

## 2023-05-29 LAB — PSA, TOTAL AND FREE
PSA, % Free: 40 % (calc) (ref >25)
PSA, Free: 0.4 ng/mL
PSA, Total: 1 ng/mL (ref <=4.0)

## 2023-05-29 LAB — TSH: TSH: 5.95 mIU/L — ABNORMAL HIGH (ref 0.40–4.50)

## 2023-05-29 LAB — HEMOGLOBIN A1C
Hgb A1c MFr Bld: 5.7 % of total Hgb — ABNORMAL HIGH (ref <5.7)
Mean Plasma Glucose: 117 mg/dL
eAG (mmol/L): 6.5 mmol/L

## 2023-05-29 MED ORDER — LISINOPRIL-HYDROCHLOROTHIAZIDE 20-12.5 MG PO TABS: ORAL_TABLET | ORAL | 3 refills | Status: AC

## 2023-06-12 LAB — COLOGUARD: COLOGUARD: NEGATIVE

## 2023-07-06 ENCOUNTER — Ambulatory Visit (INDEPENDENT_AMBULATORY_CARE_PROVIDER_SITE_OTHER): Payer: 59 | Admitting: Sports Medicine

## 2023-07-06 DIAGNOSIS — D229 Melanocytic nevi, unspecified: Secondary | ICD-10-CM

## 2023-07-06 NOTE — Assessment & Plan Note (Signed)
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 We will bring him back in a month and repeat again if needed, if insufficiently gone after that we will do a surgical shave excision.  Today I also filled out FMLA paperwork, he was out of work for 2 days with COVID.

## 2023-07-06 NOTE — Progress Notes (Signed)
    Procedures performed today:    Procedure:  Cryodestruction of large scalp nevus sebaceous Consent obtained and verified. Time-out conducted. Noted no overlying erythema, induration, or other signs of local infection. Completed without difficulty using Cryo-Gun. Advised to call if fevers/chills, erythema, induration, drainage, or persistent bleeding.  Independent interpretation of notes and tests performed by another provider:   None.  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 We will bring him back in a month and repeat again if needed, if insufficiently gone after that we will do a surgical shave excision.  Today I also filled out FMLA paperwork, he was out of work for 2 days with COVID.    ____________________________________________ Alan Gardner. Benjamin Stain, M.D., ABFM., CAQSM., AME. Primary Care and Sports Medicine [place] [facility]  Adjunct Professor of Family Medicine  [place] [employer] [facility] [employer]  Restaurant manager, fast food

## 2023-08-03 ENCOUNTER — Ambulatory Visit (INDEPENDENT_AMBULATORY_CARE_PROVIDER_SITE_OTHER): Payer: 59 | Admitting: Sports Medicine

## 2023-08-03 ENCOUNTER — Encounter: Payer: Self-pay | Admitting: Sports Medicine

## 2023-08-03 DIAGNOSIS — D229 Melanocytic nevi, unspecified: Secondary | ICD-10-CM

## 2023-08-03 NOTE — Assessment & Plan Note (Signed)
Scalp nevus sebaceous is dramatically smaller after cryotherapy #2. It is nearly imperceptible, he can return to see me as needed.

## 2023-08-03 NOTE — Progress Notes (Signed)
    Procedures performed today:    None.  Independent interpretation of notes and tests performed by another provider:   None.  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 It is nearly imperceptible, he can return to see me as needed.    ____________________________________________ Ihor Austin. Benjamin Stain, M.D., ABFM., CAQSM., AME. Primary Care and Sports Medicine [place] [facility]  Adjunct Professor of Family Medicine  [place] [employer] [facility] [employer]  Restaurant manager, fast food

## 2024-07-16 ENCOUNTER — Other Ambulatory Visit: Payer: Self-pay

## 2024-07-16 DIAGNOSIS — E038 Other specified hypothyroidism: Secondary | ICD-10-CM

## 2024-07-16 MED ORDER — LEVOTHYROXINE SODIUM 50 MCG PO TABS
50.0000 ug | ORAL_TABLET | Freq: Every day | ORAL | 0 refills | Status: AC
Start: 2024-07-16 — End: ?

## 2024-07-16 NOTE — Telephone Encounter (Signed)
 Patient has been advised an annual exam is due with fasting labs. Levothyroxine  has been refilled for 30-days to pick up once he has returned from out of town. Patient stated a 30-day fill will be enough until he is able to see Dr. ONEIDA.

## 2024-07-30 ENCOUNTER — Encounter: Payer: Self-pay | Admitting: Sports Medicine

## 2024-08-07 ENCOUNTER — Ambulatory Visit (INDEPENDENT_AMBULATORY_CARE_PROVIDER_SITE_OTHER): Admitting: Physician Assistant

## 2024-08-07 VITALS — BP 180/120 | HR 77 | Ht 73.0 in | Wt 274.0 lb

## 2024-08-07 DIAGNOSIS — M5441 Lumbago with sciatica, right side: Secondary | ICD-10-CM | POA: Diagnosis not present

## 2024-08-07 DIAGNOSIS — M48061 Spinal stenosis, lumbar region without neurogenic claudication: Secondary | ICD-10-CM

## 2024-08-07 DIAGNOSIS — I10 Essential (primary) hypertension: Secondary | ICD-10-CM

## 2024-08-07 MED ORDER — IBUPROFEN 800 MG PO TABS
800.0000 mg | ORAL_TABLET | Freq: Three times a day (TID) | ORAL | 0 refills | Status: AC | PRN
Start: 2024-08-07 — End: ?

## 2024-08-07 MED ORDER — KETOROLAC TROMETHAMINE 60 MG/2ML IM SOLN
60.0000 mg | Freq: Once | INTRAMUSCULAR | Status: AC
Start: 2024-08-07 — End: 2024-08-07
  Administered 2024-08-07: 60 mg via INTRAMUSCULAR

## 2024-08-07 MED ORDER — CYCLOBENZAPRINE HCL 10 MG PO TABS
10.0000 mg | ORAL_TABLET | Freq: Three times a day (TID) | ORAL | 0 refills | Status: AC | PRN
Start: 2024-08-07 — End: ?

## 2024-08-07 NOTE — Progress Notes (Unsigned)
 Acute Office Visit  Subjective:     Patient ID: Alan Gardner, male    DOB: 1971/10/31, 53 y.o.   MRN: [id number]  Chief Complaint  Patient presents with   Back Pain    HPI Patient is in today for low back pain for the last week that has kept in out of work. Pt has known history of lumbar spinal stenosis. Last xrays were 2023. He does not take any medication regularly for this. He was lifting heavy objects when he felt and heard a pop in low back. He then started having pain on his right low back and pain into his right leg. He denies any numbness or tingling, bowel or bladder dysfunction, leg weakness. Rates 6/10 pain on Saturday but has gotten better throughout the week. He is taking ibuprofen . He needs a note for work. This happens about 2-3 times a year and needs FMLA paperwork filled out as well.   He admits he is not taking his BP medication. He is not checking BP at home. He denies any CP, palpitations, headaches, dizziness or vision changes.    ROS See HPI.      Objective:    BP (!) 180/120 (BP Location: Right Arm, Patient Position: Sitting, Cuff Size: Normal)   Pulse 77   Ht 6' 1 (1.854 m)   Wt 274 lb (124.3 kg)   BMI 36.15 kg/m  BP Readings from Last 3 Encounters:  08/07/24 (!) 180/120  05/25/23 125/89  02/06/23 (!) 151/111   Wt Readings from Last 3 Encounters:  08/07/24 274 lb (124.3 kg)  05/25/23 274 lb (124.3 kg)  02/06/23 270 lb (122.5 kg)      Physical Exam Constitutional:      Appearance: Normal appearance. He is obese.  HENT:     Head: Normocephalic.  Cardiovascular:     Rate and Rhythm: Normal rate.  Pulmonary:     Effort: Pulmonary effort is normal.  Musculoskeletal:     Right lower leg: No edema.     Left lower leg: No edema.     Comments: Pain and decrease in ROM bending forward.  5/5 lower ext strength Negative SLR, bilaterally No lumbar spinal tenderness to palpation  Neurological:     Mental Status: He is alert and oriented to  person, place, and time.  Psychiatric:        Mood and Affect: Mood normal.          Assessment & Plan:  [name] was seen today for back pain.  Diagnoses and all orders for this visit:  Acute bilateral low back pain with right-sided sciatica -     ketorolac  (TORADOL ) injection 60 mg -     ibuprofen  (ADVIL ) 800 MG tablet; Take 1 tablet (800 mg total) by mouth every 8 (eight) hours as needed. -     cyclobenzaprine  (FLEXERIL ) 10 MG tablet; Take 1 tablet (10 mg total) by mouth 3 (three) times daily as needed for muscle spasms.  Spinal stenosis of lumbar region without neurogenic claudication -     ketorolac  (TORADOL ) injection 60 mg -     ibuprofen  (ADVIL ) 800 MG tablet; Take 1 tablet (800 mg total) by mouth every 8 (eight) hours as needed. -     cyclobenzaprine  (FLEXERIL ) 10 MG tablet; Take 1 tablet (10 mg total) by mouth 3 (three) times daily as needed for muscle spasms.  Essential hypertension, benign  Uncontrolled hypertension   Very concerned with patients blood pressure today 2nd recheck did not  go down He is in active pain and he did not take BP medication for the last few days He is asymptomatic Urged him to start back on BP medication  Start checking BP at home  RTC in 2 weeks for nurse visit to make sure BP in normal range and no medication adjustments need to be made  Hx of lumbar spinal stenosis and low back pain flares Last xrays done in 2023 No red flags today in clinic Toradol  60mg  IM given in office today Return to exercises that PT taught you Ibuprofen  800mg  to take as needed Flexeril  as needed for muscle spasms, sedation warning given Consider heating pads, tens unit, icy hot patches If not improving need to consider more imaging and management Note written for patient to be out of work this week and to return to work on Sunday 9/14 Intermittent FLMA filled out while patient in office today  Spent 40 minutes in chart review to fill out FMLA paperwork and  to discuss ongoing treatment plan with urgency to treat elevated blood pressure and to make sure returning to normal.     Vermell Bologna, PA-C

## 2024-08-07 NOTE — Patient Instructions (Addendum)
 Ibuprofen  800mg  as needed up three times a day Flexeril  as needed for muscle spasm Consider heating pad and tens unit and then place icy hot patches  Low Back Sprain or Strain Rehab Ask your health care provider which exercises are safe for you. Do exercises exactly as told by your health care provider and adjust them as directed. It is normal to feel mild stretching, pulling, tightness, or discomfort as you do these exercises. Stop right away if you feel sudden pain or your pain gets worse. Do not begin these exercises until told by your health care provider. Stretching and range-of-motion exercises These exercises warm up your muscles and joints and improve the movement and flexibility of your back. These exercises also help to relieve pain, numbness, and tingling. Lumbar rotation  Lie on your back on a firm bed or the floor with your knees bent. Straighten your arms out to your sides so each arm forms a 90-degree angle (right angle) with a side of your body. Slowly move (rotate) both of your knees to one side of your body until you feel a stretch in your lower back (lumbar). Try not to let your shoulders lift off the floor. Hold this position for __________ seconds. Tense your abdominal muscles and slowly move your knees back to the starting position. Repeat this exercise on the other side of your body. Repeat __________ times. Complete this exercise __________ times a day. Single knee to chest  Lie on your back on a firm bed or the floor with both legs straight. Bend one of your knees. Use your hands to move your knee up toward your chest until you feel a gentle stretch in your lower back and buttock. Hold your leg in this position by holding on to the front of your knee. Keep your other leg as straight as possible. Hold this position for __________ seconds. Slowly return to the starting position. Repeat with your other leg. Repeat __________ times. Complete this exercise __________  times a day. Prone extension on elbows  Lie on your abdomen on a firm bed or the floor (prone position). Prop yourself up on your elbows. Use your arms to help lift your chest up until you feel a gentle stretch in your abdomen and your lower back. This will place some of your body weight on your elbows. If this is uncomfortable, try stacking pillows under your chest. Your hips should stay down, against the surface that you are lying on. Keep your hip and back muscles relaxed. Hold this position for __________ seconds. Slowly relax your upper body and return to the starting position. Repeat __________ times. Complete this exercise __________ times a day. Strengthening exercises These exercises build strength and endurance in your back. Endurance is the ability to use your muscles for a long time, even after they get tired. Pelvic tilt This exercise strengthens the muscles that lie deep in the abdomen. Lie on your back on a firm bed or the floor with your legs extended. Bend your knees so they are pointing toward the ceiling and your feet are flat on the floor. Tighten your lower abdominal muscles to press your lower back against the floor. This motion will tilt your pelvis so your tailbone points up toward the ceiling instead of pointing to your feet or the floor. To help with this exercise, you may place a small towel under your lower back and try to push your back into the towel. Hold this position for __________ seconds. Let your  muscles relax completely before you repeat this exercise. Repeat __________ times. Complete this exercise __________ times a day. Alternating arm and leg raises  Get on your hands and knees on a firm surface. If you are on a hard floor, you may want to use padding, such as an exercise mat, to cushion your knees. Line up your arms and legs. Your hands should be directly below your shoulders, and your knees should be directly below your hips. Lift your left leg  behind you. At the same time, raise your right arm and straighten it in front of you. Do not lift your leg higher than your hip. Do not lift your arm higher than your shoulder. Keep your abdominal and back muscles tight. Keep your hips facing the ground. Do not arch your back. Keep your balance carefully, and do not hold your breath. Hold this position for __________ seconds. Slowly return to the starting position. Repeat with your right leg and your left arm. Repeat __________ times. Complete this exercise __________ times a day. Abdominal set with straight leg raise  Lie on your back on a firm bed or the floor. Bend one of your knees and keep your other leg straight. Tense your abdominal muscles and lift your straight leg up, 4-6 inches (10-15 cm) off the ground. Keep your abdominal muscles tight and hold this position for __________ seconds. Do not hold your breath. Do not arch your back. Keep it flat against the ground. Keep your abdominal muscles tense as you slowly lower your leg back to the starting position. Repeat with your other leg. Repeat __________ times. Complete this exercise __________ times a day. Single leg lower with bent knees Lie on your back on a firm bed or the floor. Tense your abdominal muscles and lift your feet off the floor, one foot at a time, so your knees and hips are bent in 90-degree angles (right angles). Your knees should be over your hips and your lower legs should be parallel to the floor. Keeping your abdominal muscles tense and your knee bent, slowly lower one of your legs so your toe touches the ground. Lift your leg back up to return to the starting position. Do not hold your breath. Do not let your back arch. Keep your back flat against the ground. Repeat with your other leg. Repeat __________ times. Complete this exercise __________ times a day. Posture and body mechanics Good posture and healthy body mechanics can help to relieve stress in  your body's tissues and joints. Body mechanics refers to the movements and positions of your body while you do your daily activities. Posture is part of body mechanics. Good posture means: Your spine is in its natural S-curve position (neutral). Your shoulders are pulled back slightly. Your head is not tipped forward (neutral). Follow these guidelines to improve your posture and body mechanics in your everyday activities. Standing  When standing, keep your spine neutral and your feet about hip-width apart. Keep a slight bend in your knees. Your ears, shoulders, and hips should line up. When you do a task in which you stand in one place for a long time, place one foot up on a stable object that is 2-4 inches (5-10 cm) high, such as a footstool. This helps keep your spine neutral. Sitting  When sitting, keep your spine neutral and keep your feet flat on the floor. Use a footrest, if necessary, and keep your thighs parallel to the floor. Avoid rounding your shoulders, and avoid tilting  your head forward. When working at a desk or a computer, keep your desk at a height where your hands are slightly lower than your elbows. Slide your chair under your desk so you are close enough to maintain good posture. When working at a computer, place your monitor at a height where you are looking straight ahead and you do not have to tilt your head forward or downward to look at the screen. Resting When lying down and resting, avoid positions that are most painful for you. If you have pain with activities such as sitting, bending, stooping, or squatting, lie in a position in which your body does not bend very much. For example, avoid curling up on your side with your arms and knees near your chest (fetal position). If you have pain with activities such as standing for a long time or reaching with your arms, lie with your spine in a neutral position and bend your knees slightly. Try the following positions: Lying on  your side with a pillow between your knees. Lying on your back with a pillow under your knees. Lifting  When lifting objects, keep your feet at least shoulder-width apart and tighten your abdominal muscles. Bend your knees and hips and keep your spine neutral. It is important to lift using the strength of your legs, not your back. Do not lock your knees straight out. Always ask for help to lift heavy or awkward objects. This information is not intended to replace advice given to you by your health care provider. Make sure you discuss any questions you have with your health care provider. Document Revised: 03/20/2023 Document Reviewed: 02/01/2021 [employer] Patient Education  2024 [employer].

## 2024-08-09 ENCOUNTER — Encounter: Payer: Self-pay | Admitting: Physician Assistant
